# Patient Record
Sex: Female | Born: 1960
Health system: Southern US, Community
[De-identification: ages and names within clinical notes are randomized; demographics above are authoritative.]

## PROBLEM LIST (undated history)

## (undated) DIAGNOSIS — Z923 Personal history of irradiation: Secondary | ICD-10-CM

## (undated) DIAGNOSIS — M1990 Unspecified osteoarthritis, unspecified site: Secondary | ICD-10-CM

## (undated) DIAGNOSIS — E039 Hypothyroidism, unspecified: Secondary | ICD-10-CM

## (undated) DIAGNOSIS — C50919 Malignant neoplasm of unspecified site of unspecified female breast: Secondary | ICD-10-CM

## (undated) DIAGNOSIS — I499 Cardiac arrhythmia, unspecified: Secondary | ICD-10-CM

## (undated) DIAGNOSIS — E559 Vitamin D deficiency, unspecified: Secondary | ICD-10-CM

## (undated) DIAGNOSIS — K219 Gastro-esophageal reflux disease without esophagitis: Secondary | ICD-10-CM

## (undated) DIAGNOSIS — Z8042 Family history of malignant neoplasm of prostate: Secondary | ICD-10-CM

## (undated) DIAGNOSIS — Z803 Family history of malignant neoplasm of breast: Secondary | ICD-10-CM

## (undated) DIAGNOSIS — I10 Essential (primary) hypertension: Secondary | ICD-10-CM

## (undated) HISTORY — DX: Vitamin D deficiency, unspecified: E55.9

## (undated) HISTORY — DX: Essential (primary) hypertension: I10

## (undated) HISTORY — PX: LAPAROSCOPIC HYSTERECTOMY: SHX1926

## (undated) HISTORY — DX: Family history of malignant neoplasm of breast: Z80.3

## (undated) HISTORY — PX: INCONTINENCE SURGERY: SHX676

## (undated) HISTORY — DX: Family history of malignant neoplasm of prostate: Z80.42

## (undated) HISTORY — PX: ABDOMINAL HYSTERECTOMY: SHX81

## (undated) HISTORY — PX: TONSILLECTOMY: SUR1361

## (undated) HISTORY — DX: Malignant neoplasm of unspecified site of unspecified female breast: C50.919

---

## 2005-06-18 ENCOUNTER — Ambulatory Visit: Payer: Self-pay | Admitting: Otolaryngology

## 2005-07-01 ENCOUNTER — Ambulatory Visit: Payer: Self-pay | Admitting: Unknown Physician Specialty

## 2006-02-21 ENCOUNTER — Ambulatory Visit: Payer: Self-pay | Admitting: Otolaryngology

## 2006-05-27 ENCOUNTER — Ambulatory Visit: Payer: Self-pay | Admitting: Otolaryngology

## 2006-06-15 ENCOUNTER — Ambulatory Visit: Payer: Self-pay | Admitting: Otolaryngology

## 2007-01-06 ENCOUNTER — Ambulatory Visit: Payer: Self-pay | Admitting: Unknown Physician Specialty

## 2007-05-02 ENCOUNTER — Ambulatory Visit: Payer: Self-pay | Admitting: Unknown Physician Specialty

## 2007-10-13 ENCOUNTER — Emergency Department: Payer: Self-pay | Admitting: Emergency Medicine

## 2008-06-06 ENCOUNTER — Ambulatory Visit: Payer: Self-pay | Admitting: Unknown Physician Specialty

## 2008-06-12 ENCOUNTER — Ambulatory Visit: Payer: Self-pay | Admitting: Unknown Physician Specialty

## 2008-07-26 HISTORY — PX: NASAL SINUS SURGERY: SHX719

## 2011-01-06 ENCOUNTER — Ambulatory Visit: Payer: Self-pay | Admitting: Internal Medicine

## 2011-01-13 ENCOUNTER — Ambulatory Visit: Payer: Self-pay | Admitting: Internal Medicine

## 2011-06-16 ENCOUNTER — Ambulatory Visit: Payer: Self-pay | Admitting: Internal Medicine

## 2011-06-25 ENCOUNTER — Ambulatory Visit: Payer: Self-pay | Admitting: Internal Medicine

## 2011-07-27 HISTORY — PX: BREAST BIOPSY: SHX20

## 2011-08-06 ENCOUNTER — Ambulatory Visit: Payer: Self-pay | Admitting: Gastroenterology

## 2012-01-10 ENCOUNTER — Ambulatory Visit: Payer: Self-pay | Admitting: Surgery

## 2012-01-26 ENCOUNTER — Ambulatory Visit: Payer: Self-pay | Admitting: Surgery

## 2012-07-12 ENCOUNTER — Ambulatory Visit: Payer: Self-pay | Admitting: Surgery

## 2013-12-13 ENCOUNTER — Ambulatory Visit: Payer: Self-pay | Admitting: Internal Medicine

## 2014-07-22 ENCOUNTER — Ambulatory Visit: Payer: Self-pay | Admitting: Orthopedic Surgery

## 2014-09-20 ENCOUNTER — Encounter: Payer: Self-pay | Admitting: Surgery

## 2014-09-24 ENCOUNTER — Encounter
Admit: 2014-09-24 | Disposition: A | Discharge status: Home / Self Care | Payer: Self-pay | Attending: Surgery | Admitting: Surgery

## 2014-09-27 DIAGNOSIS — E538 Deficiency of other specified B group vitamins: Secondary | ICD-10-CM | POA: Insufficient documentation

## 2014-10-25 ENCOUNTER — Encounter
Admit: 2014-10-25 | Disposition: A | Discharge status: Home / Self Care | Payer: Self-pay | Attending: Surgery | Admitting: Surgery

## 2014-11-08 ENCOUNTER — Other Ambulatory Visit: Payer: Self-pay | Admitting: Internal Medicine

## 2014-11-08 DIAGNOSIS — Z1231 Encounter for screening mammogram for malignant neoplasm of breast: Secondary | ICD-10-CM

## 2014-12-16 ENCOUNTER — Ambulatory Visit
Admission: RE | Admit: 2014-12-16 | Discharge: 2014-12-16 | Disposition: A | Discharge status: Home / Self Care | Payer: 59 | Source: Ambulatory Visit | Attending: Internal Medicine | Admitting: Internal Medicine

## 2014-12-16 DIAGNOSIS — Z1231 Encounter for screening mammogram for malignant neoplasm of breast: Secondary | ICD-10-CM | POA: Diagnosis present

## 2015-09-17 DIAGNOSIS — R5383 Other fatigue: Secondary | ICD-10-CM | POA: Diagnosis not present

## 2015-09-17 DIAGNOSIS — R030 Elevated blood-pressure reading, without diagnosis of hypertension: Secondary | ICD-10-CM | POA: Diagnosis not present

## 2015-09-29 ENCOUNTER — Other Ambulatory Visit: Payer: Self-pay | Admitting: Internal Medicine

## 2015-09-29 DIAGNOSIS — Z0000 Encounter for general adult medical examination without abnormal findings: Secondary | ICD-10-CM | POA: Diagnosis not present

## 2015-09-29 DIAGNOSIS — Z1231 Encounter for screening mammogram for malignant neoplasm of breast: Secondary | ICD-10-CM

## 2015-12-19 ENCOUNTER — Ambulatory Visit: Payer: 59

## 2015-12-23 DIAGNOSIS — I781 Nevus, non-neoplastic: Secondary | ICD-10-CM | POA: Diagnosis not present

## 2015-12-23 DIAGNOSIS — I8391 Asymptomatic varicose veins of right lower extremity: Secondary | ICD-10-CM | POA: Diagnosis not present

## 2015-12-30 ENCOUNTER — Other Ambulatory Visit: Payer: Self-pay | Admitting: Internal Medicine

## 2015-12-30 ENCOUNTER — Ambulatory Visit
Admission: RE | Admit: 2015-12-30 | Discharge: 2015-12-30 | Disposition: A | Discharge status: Home / Self Care | Payer: 59 | Source: Ambulatory Visit | Attending: Internal Medicine | Admitting: Internal Medicine

## 2015-12-30 DIAGNOSIS — Z1231 Encounter for screening mammogram for malignant neoplasm of breast: Secondary | ICD-10-CM | POA: Diagnosis not present

## 2016-05-21 DIAGNOSIS — I10 Essential (primary) hypertension: Secondary | ICD-10-CM | POA: Insufficient documentation

## 2016-05-21 DIAGNOSIS — E538 Deficiency of other specified B group vitamins: Secondary | ICD-10-CM | POA: Diagnosis not present

## 2016-07-08 DIAGNOSIS — H01003 Unspecified blepharitis right eye, unspecified eyelid: Secondary | ICD-10-CM | POA: Diagnosis not present

## 2017-10-21 ENCOUNTER — Other Ambulatory Visit: Payer: Self-pay | Admitting: Internal Medicine

## 2017-10-21 DIAGNOSIS — Z1231 Encounter for screening mammogram for malignant neoplasm of breast: Secondary | ICD-10-CM

## 2017-11-10 ENCOUNTER — Ambulatory Visit
Admission: RE | Admit: 2017-11-10 | Discharge: 2017-11-10 | Disposition: A | Discharge status: Home / Self Care | Payer: No Typology Code available for payment source | Source: Ambulatory Visit | Attending: Internal Medicine | Admitting: Internal Medicine

## 2017-11-10 DIAGNOSIS — Z1231 Encounter for screening mammogram for malignant neoplasm of breast: Secondary | ICD-10-CM | POA: Diagnosis not present

## 2018-07-26 DIAGNOSIS — C50919 Malignant neoplasm of unspecified site of unspecified female breast: Secondary | ICD-10-CM

## 2018-07-26 HISTORY — DX: Malignant neoplasm of unspecified site of unspecified female breast: C50.919

## 2019-07-27 HISTORY — PX: BREAST BIOPSY: SHX20

## 2019-09-04 ENCOUNTER — Other Ambulatory Visit: Payer: Self-pay | Admitting: Internal Medicine

## 2019-09-04 DIAGNOSIS — Z1231 Encounter for screening mammogram for malignant neoplasm of breast: Secondary | ICD-10-CM

## 2019-10-18 ENCOUNTER — Ambulatory Visit
Admission: RE | Admit: 2019-10-18 | Discharge: 2019-10-18 | Disposition: A | Discharge status: Home / Self Care | Payer: No Typology Code available for payment source | Source: Ambulatory Visit | Attending: Internal Medicine | Admitting: Internal Medicine

## 2019-10-18 DIAGNOSIS — Z1231 Encounter for screening mammogram for malignant neoplasm of breast: Secondary | ICD-10-CM | POA: Insufficient documentation

## 2019-10-24 ENCOUNTER — Other Ambulatory Visit: Payer: Self-pay | Admitting: Internal Medicine

## 2019-10-24 DIAGNOSIS — N6310 Unspecified lump in the right breast, unspecified quadrant: Secondary | ICD-10-CM

## 2019-10-24 DIAGNOSIS — R928 Other abnormal and inconclusive findings on diagnostic imaging of breast: Secondary | ICD-10-CM

## 2019-11-09 ENCOUNTER — Ambulatory Visit
Admission: RE | Admit: 2019-11-09 | Discharge: 2019-11-09 | Disposition: A | Discharge status: Home / Self Care | Payer: No Typology Code available for payment source | Source: Ambulatory Visit | Attending: Internal Medicine | Admitting: Internal Medicine

## 2019-11-09 DIAGNOSIS — R928 Other abnormal and inconclusive findings on diagnostic imaging of breast: Secondary | ICD-10-CM | POA: Insufficient documentation

## 2019-11-09 DIAGNOSIS — N6310 Unspecified lump in the right breast, unspecified quadrant: Secondary | ICD-10-CM | POA: Diagnosis present

## 2019-11-19 ENCOUNTER — Other Ambulatory Visit: Payer: Self-pay | Admitting: Internal Medicine

## 2019-11-19 DIAGNOSIS — R928 Other abnormal and inconclusive findings on diagnostic imaging of breast: Secondary | ICD-10-CM

## 2019-11-19 DIAGNOSIS — N6310 Unspecified lump in the right breast, unspecified quadrant: Secondary | ICD-10-CM

## 2019-11-27 ENCOUNTER — Ambulatory Visit: Admission: RE | Admit: 2019-11-27 | Payer: No Typology Code available for payment source | Source: Ambulatory Visit

## 2019-12-12 ENCOUNTER — Ambulatory Visit
Admission: RE | Admit: 2019-12-12 | Discharge: 2019-12-12 | Disposition: A | Discharge status: Home / Self Care | Payer: No Typology Code available for payment source | Source: Ambulatory Visit | Attending: Internal Medicine | Admitting: Internal Medicine

## 2019-12-12 DIAGNOSIS — R928 Other abnormal and inconclusive findings on diagnostic imaging of breast: Secondary | ICD-10-CM | POA: Insufficient documentation

## 2019-12-12 DIAGNOSIS — N6310 Unspecified lump in the right breast, unspecified quadrant: Secondary | ICD-10-CM

## 2019-12-13 ENCOUNTER — Encounter: Payer: Self-pay | Admitting: *Deleted

## 2019-12-13 NOTE — Progress Notes (Signed)
Called patient to establish navigation services.  Left message for her to return my call.

## 2019-12-14 ENCOUNTER — Other Ambulatory Visit: Payer: Self-pay

## 2019-12-14 DIAGNOSIS — C50919 Malignant neoplasm of unspecified site of unspecified female breast: Secondary | ICD-10-CM

## 2019-12-18 ENCOUNTER — Encounter: Payer: Self-pay | Admitting: Oncology

## 2019-12-18 ENCOUNTER — Other Ambulatory Visit: Payer: Self-pay

## 2019-12-18 ENCOUNTER — Other Ambulatory Visit: Payer: Self-pay | Admitting: General Surgery

## 2019-12-18 ENCOUNTER — Inpatient Hospital Stay: Payer: No Typology Code available for payment source

## 2019-12-18 ENCOUNTER — Encounter: Payer: Self-pay | Admitting: *Deleted

## 2019-12-18 ENCOUNTER — Inpatient Hospital Stay: Payer: No Typology Code available for payment source | Attending: Oncology | Admitting: Oncology

## 2019-12-18 VITALS — BP 111/66 | HR 74 | Temp 98.3°F | Resp 16 | Ht 63.0 in | Wt 125.0 lb

## 2019-12-18 DIAGNOSIS — C50411 Malignant neoplasm of upper-outer quadrant of right female breast: Secondary | ICD-10-CM

## 2019-12-18 DIAGNOSIS — Z7189 Other specified counseling: Secondary | ICD-10-CM

## 2019-12-18 DIAGNOSIS — Z170 Estrogen receptor positive status [ER+]: Secondary | ICD-10-CM

## 2019-12-18 LAB — SURGICAL PATHOLOGY

## 2019-12-18 NOTE — Progress Notes (Signed)
Met patient today during her medical oncology consult.  She is newly diagnosed with invasive breast cancer.  Per Dr. Janese Banks it's a clinical stage 1/ ER/PR positivie/ Her 2 negative breast cancer.  She is scheduled for surgery on 01/10/20.  Current plan for surgery followed by radiation and antihormonal therapy and possible Oncoptype Dx testing.  Dr. Janese Banks has recommended genetic testing also.  Gave patient breast cancer educational literature, "My Breast Cancer Treatment Handbook" by Josephine Igo, RN.  She is to call with any questions or needs.

## 2019-12-18 NOTE — Progress Notes (Signed)
Pt is a new breast cancer.

## 2019-12-20 ENCOUNTER — Ambulatory Visit: Payer: Self-pay | Admitting: General Surgery

## 2019-12-20 NOTE — H&P (View-Only) (Signed)
PATIENT PROFILE: Sheri [name] is a 59 y.o. female who presents to the Clinic for consultation at the request of Dr. Sabra Heck for evaluation of breast cancer.  PCP:  Yevonne Pax, MD  HISTORY OF PRESENT ILLNESS: Sheri [name] reports she had her usual screening mammogram.  The mammogram shows suspicious area on the right breast.  This led to diagnostic ultrasound and mammogram.  Discomfort and a small mass in the right breast.  Core needle biopsy was done showing invasive mammary carcinoma with associated DCIS.  Patient denies any breast pain, skin changes, palpable masses or lymphadenopathy.  Family history of breast cancer: Grandmother Family history of other cancers: None Menarche: 59 years old Menopause: In her 35s Used OCP: Unknown Used estrogen and progesterone therapy: Premarin and estrogen patch History of Radiation to the chest: No previous chest radiation Number of pregnancies: 2 Age of first pregnancy: 59 years old Previous breast biopsy: None  PROBLEM LIST:        Problem List  Date Reviewed: 09/07/2019       Noted   Eczema of right eyelid 08/08/2017   Overview    Chronic, recurring      Benign essential hypertension 05/21/2016   B12 deficiency 09/27/2014   Overview    267, 2015      Rosacea Unknown   Premature ovarian failure Unknown   Cervical disc disease Unknown   Overview    C5-6         GENERAL REVIEW OF SYSTEMS:   General ROS: negative for - chills, fatigue, fever, weight gain or weight loss Allergy and Immunology ROS: negative for - hives  Hematological and Lymphatic ROS: negative for - bleeding problems or bruising, negative for palpable nodes Endocrine ROS: negative for - heat or cold intolerance, hair changes Respiratory ROS: negative for - cough, shortness of breath or wheezing Cardiovascular ROS: no chest pain or palpitations GI ROS: negative for nausea, vomiting, abdominal pain, diarrhea,  constipation Musculoskeletal ROS: negative for - joint swelling or muscle pain Neurological ROS: negative for - confusion, syncope Dermatological ROS: negative for pruritus and rash Psychiatric: negative for anxiety, depression, difficulty sleeping and memory loss  MEDICATIONS: Current Medications        Current Outpatient Medications  Medication Sig Dispense Refill  . CALCIUM ACETATE ORAL Take 1,000 mg by mouth 3 (three) times a week.     . cholecalciferol (CHOLECALCIFEROL) 1,000 unit tablet Take 1,000 Units by mouth once daily.    . cyanocobalamin (VITAMIN B12) 1000 MCG tablet Take 1,000 mcg by mouth twice a week.      . estradioL (CLIMARA) 0.0375 mg/24 hr patch Place 1 patch onto the skin once a week 12 patch 3  . hydroCHLOROthiazide (MICROZIDE) 12.5 mg capsule Take 1 capsule (12.5 mg total) by mouth once daily (Patient taking differently: Take 12.5 mg by mouth once daily Takes once or twice weekly  ) 90 capsule 3  . metoprolol succinate (TOPROL-XL) 25 MG XL tablet Take 1 tablet (25 mg total) by mouth once daily (Patient taking differently: Take 25 mg by mouth once daily 1/4 of the pill once weekly  ) 90 tablet 3  . traZODone (DESYREL) 50 MG tablet Take 1 tablet (50 mg total) by mouth nightly (Patient taking differently: Take 50 mg by mouth nightly 0.5 tablet every other night  ) 30 tablet 11  . vitamin B complex (B COMPLEX 1 ORAL) Take by mouth Takes with B12 1x week      . sertraline (  ZOLOFT) 50 MG tablet Take 1 tablet (50 mg total) by mouth once daily (Patient not taking: Reported on 12/18/2019  ) 90 tablet 3   No current facility-administered medications for this visit.      ALLERGIES: Patient has no known allergies.  PAST MEDICAL HISTORY:     Past Medical History:  Diagnosis Date  . Allergic state   . Breast cancer (CMS-HCC) 11/2019  . Cervical disc disease    C5-6  . Eczema, unspecified   . Ovarian cyst, bilateral   . Premature ovarian failure   .  Rosacea   . Ventricular ectopy     PAST SURGICAL HISTORY:      Past Surgical History:  Procedure Laterality Date  . CESAREAN SECTION    . TONSILLECTOMY    . TUBAL LIGATION       FAMILY HISTORY:      Family History  Problem Relation Age of Onset  . No Known Problems Mother   . Breast cancer Maternal Grandmother      SOCIAL HISTORY: Social History          Socioeconomic History  . Marital status: Married    Spouse name: Not on file  . Number of children: Not on file  . Years of education: Not on file  . Highest education level: Not on file  Occupational History  . Not on file  Tobacco Use  . Smoking status: Former Smoker    Packs/day: 1.00    Years: 15.00    Pack years: 15.00    Quit date: 05/05/1991    Years since quitting: 28.6  . Smokeless tobacco: Never Used  Vaping Use  . Vaping Use: Never used  Substance and Sexual Activity  . Alcohol use: Yes    Alcohol/week: 1.0 standard drinks    Types: 1 Glasses of wine per week  . Drug use: No  . Sexual activity: Not on file  Other Topics Concern  . Not on file  Social History Narrative  . Not on file   Social Determinants of Health      Financial Resource Strain:   . Difficulty of Paying Living Expenses:   Food Insecurity:   . Worried About Charity fundraiser in the Last Year:   . Arboriculturist in the Last Year:   Transportation Needs:   . Film/video editor (Medical):   Marland Kitchen Lack of Transportation (Non-Medical):       PHYSICAL EXAM:    Vitals:   12/18/19 1123  BP: 93/63  Pulse: 68   Body mass index is 24.4 kg/m. Weight: 60.5 kg (133 lb 6.4 oz)   GENERAL: Alert, active, oriented x3  HEENT: Pupils equal reactive to light. Extraocular movements are intact. Sclera clear. Palpebral conjunctiva normal red color.Pharynx clear.  NECK: Supple with no palpable mass and no adenopathy.  LUNGS: Sound clear with no rales rhonchi or wheezes.  HEART:  Regular rhythm S1 and S2 without murmur.  BREAST: breasts appear normal, no suspicious masses, no skin or nipple changes or axillary nodes.  ABDOMEN: Soft and depressible, nontender with no palpable mass, no hepatomegaly.  EXTREMITIES: Well-developed well-nourished symmetrical with no dependent edema.  NEUROLOGICAL: Awake alert oriented, facial expression symmetrical, moving all extremities.  REVIEW OF DATA: I have reviewed the following data today:      No visits with results within 3 Month(s) from this visit.  Latest known visit with results is:  Office Visit on 09/07/2019  Component Date Value  .  Color 09/07/2019 Yellow   . Clarity 09/07/2019 Clear   . Specific Gravity 09/07/2019 <=1.005   . pH, Urine 09/07/2019 6.0   . Protein, Urinalysis 09/07/2019 Negative   . Glucose, Urinalysis 09/07/2019 Negative   . Ketones, Urinalysis 09/07/2019 Negative   . Blood, Urinalysis 09/07/2019 Trace*  . Nitrite, Urinalysis 09/07/2019 Negative   . Leukocyte Esterase, Urin* 09/07/2019 Negative   . White Blood Cells, Urina* 09/07/2019 0-3   . Red Blood Cells, Urinaly* 09/07/2019 4-10*  . Bacteria, Urinalysis 09/07/2019 None Seen   . Squamous Epithelial Cell* 09/07/2019 Rare      ASSESSMENT: Ms. Zalenski is a 59 y.o. female presenting for consultation for right breast cancer.    Patient was oriented again about the pathology results. Surgical alternatives were discussed with patient including partial vs total mastectomy. Surgical technique and post operative care was discussed with patient. Risk of surgery was discussed with patient including but not limited to: wound infection, seroma, hematoma, brachial plexopathy, mondor's disease (thrombosis of small veins of breast), chronic wound pain, breast lymphedema, altered sensation to the nipple and cosmesis among others.   We will follow up ER/PR and HER-2 receptors results and medical oncology recommendations for final surgical  decision.  Malignant neoplasm of upper-outer quadrant of right female breast, unspecified estrogen receptor status (CMS-HCC) [C50.411]  PLAN: 1.  Right breast radiofrequency tag guided partial mastectomy and sentinel node biopsy (19301, [id number]) 2.  Avoid taking aspirin 5 days before the surgery 3.  Expect medical oncology evaluation today for further discussion of other treatments for breast cancer 4.  We will keep in contact with you if anything changes after the ER receptors.  Patient verbalized understanding, all questions were answered, and were agreeable with the plan outlined above.     Herbert Pun, MD  Electronically signed by Herbert Pun, MD

## 2019-12-20 NOTE — H&P (Signed)
PATIENT PROFILE: Sheri [name] is a 59 y.o. [name] who presents to the Clinic for consultation at the request of Dr. Sabra Heck for evaluation of breast cancer.  PCP:  Yevonne Pax, MD  HISTORY OF PRESENT ILLNESS: Sheri [name] reports she had her usual screening mammogram.  The mammogram shows suspicious area on the right breast.  This led to diagnostic ultrasound and mammogram.  Discomfort and a small mass in the right breast.  Core needle biopsy was done showing invasive mammary carcinoma with associated DCIS.  Patient denies any breast pain, skin changes, palpable masses or lymphadenopathy.  Family history of breast cancer: Grandmother Family history of other cancers: None Menarche: 59 years old Menopause: In her 63s Used OCP: Unknown Used estrogen and progesterone therapy: Premarin and estrogen patch History of Radiation to the chest: No previous chest radiation Number of pregnancies: 2 Age of first pregnancy: 59 years old Previous breast biopsy: None  PROBLEM LIST:        Problem List  Date Reviewed: 09/07/2019       Noted   Eczema of right eyelid 08/08/2017   Overview    Chronic, recurring      Benign essential hypertension 05/21/2016   B12 deficiency 09/27/2014   Overview    267, 2015      Rosacea Unknown   Premature ovarian failure Unknown   Cervical disc disease Unknown   Overview    C5-6         GENERAL REVIEW OF SYSTEMS:   General ROS: negative for - chills, fatigue, fever, weight gain or weight loss Allergy and Immunology ROS: negative for - hives  Hematological and Lymphatic ROS: negative for - bleeding problems or bruising, negative for palpable nodes Endocrine ROS: negative for - heat or cold intolerance, hair changes Respiratory ROS: negative for - cough, shortness of breath or wheezing Cardiovascular ROS: no chest pain or palpitations GI ROS: negative for nausea, vomiting, abdominal pain, diarrhea,  constipation Musculoskeletal ROS: negative for - joint swelling or muscle pain Neurological ROS: negative for - confusion, syncope Dermatological ROS: negative for pruritus and rash Psychiatric: negative for anxiety, depression, difficulty sleeping and memory loss  MEDICATIONS: Current Medications        Current Outpatient Medications  Medication Sig Dispense Refill  . CALCIUM ACETATE ORAL Take 1,000 mg by mouth 3 (three) times a week.     . cholecalciferol (CHOLECALCIFEROL) 1,000 unit tablet Take 1,000 Units by mouth once daily.    . cyanocobalamin (VITAMIN B12) 1000 MCG tablet Take 1,000 mcg by mouth twice a week.      . estradioL (CLIMARA) 0.0375 mg/24 hr patch Place 1 patch onto the skin once a week 12 patch 3  . hydroCHLOROthiazide (MICROZIDE) 12.5 mg capsule Take 1 capsule (12.5 mg total) by mouth once daily (Patient taking differently: Take 12.5 mg by mouth once daily Takes once or twice weekly  ) 90 capsule 3  . metoprolol succinate (TOPROL-XL) 25 MG XL tablet Take 1 tablet (25 mg total) by mouth once daily (Patient taking differently: Take 25 mg by mouth once daily 1/4 of the pill once weekly  ) 90 tablet 3  . traZODone (DESYREL) 50 MG tablet Take 1 tablet (50 mg total) by mouth nightly (Patient taking differently: Take 50 mg by mouth nightly 0.5 tablet every other night  ) 30 tablet 11  . vitamin B complex (B COMPLEX 1 ORAL) Take by mouth Takes with B12 1x week      . sertraline (  ZOLOFT) 50 MG tablet Take 1 tablet (50 mg total) by mouth once daily (Patient not taking: Reported on 12/18/2019  ) 90 tablet 3   No current facility-administered medications for this visit.      ALLERGIES: Patient has no known allergies.  PAST MEDICAL HISTORY:     Past Medical History:  Diagnosis Date  . Allergic state   . Breast cancer (CMS-HCC) 11/2019  . Cervical disc disease    C5-6  . Eczema, unspecified   . Ovarian cyst, bilateral   . Premature ovarian failure   .  Rosacea   . Ventricular ectopy     PAST SURGICAL HISTORY:      Past Surgical History:  Procedure Laterality Date  . CESAREAN SECTION    . TONSILLECTOMY    . TUBAL LIGATION       FAMILY HISTORY:      Family History  Problem Relation Age of Onset  . No Known Problems Mother   . Breast cancer Maternal Grandmother      SOCIAL HISTORY: Social History          Socioeconomic History  . Marital status: Married    Spouse name: Not on file  . Number of children: Not on file  . Years of education: Not on file  . Highest education level: Not on file  Occupational History  . Not on file  Tobacco Use  . Smoking status: Former Smoker    Packs/day: 1.00    Years: 15.00    Pack years: 15.00    Quit date: 05/05/1991    Years since quitting: 28.6  . Smokeless tobacco: Never Used  Vaping Use  . Vaping Use: Never used  Substance and Sexual Activity  . Alcohol use: Yes    Alcohol/week: 1.0 standard drinks    Types: 1 Glasses of wine per week  . Drug use: No  . Sexual activity: Not on file  Other Topics Concern  . Not on file  Social History Narrative  . Not on file   Social Determinants of Health      Financial Resource Strain:   . Difficulty of Paying Living Expenses:   Food Insecurity:   . Worried About Charity fundraiser in the Last Year:   . Arboriculturist in the Last Year:   Transportation Needs:   . Film/video editor (Medical):   Marland Kitchen Lack of Transportation (Non-Medical):       PHYSICAL EXAM:    Vitals:   12/18/19 1123  BP: 93/63  Pulse: 68   Body mass index is 24.4 kg/m. Weight: 60.5 kg (133 lb 6.4 oz)   GENERAL: Alert, active, oriented x3  HEENT: Pupils equal reactive to light. Extraocular movements are intact. Sclera clear. Palpebral conjunctiva normal red color.Pharynx clear.  NECK: Supple with no palpable mass and no adenopathy.  LUNGS: Sound clear with no rales rhonchi or wheezes.  HEART:  Regular rhythm S1 and S2 without murmur.  BREAST: breasts appear normal, no suspicious masses, no skin or nipple changes or axillary nodes.  ABDOMEN: Soft and depressible, nontender with no palpable mass, no hepatomegaly.  EXTREMITIES: Well-developed well-nourished symmetrical with no dependent edema.  NEUROLOGICAL: Awake alert oriented, facial expression symmetrical, moving all extremities.  REVIEW OF DATA: I have reviewed the following data today:      No visits with results within 3 Month(s) from this visit.  Latest known visit with results is:  Office Visit on 09/07/2019  Component Date Value  .  Color 09/07/2019 Yellow   . Clarity 09/07/2019 Clear   . Specific Gravity 09/07/2019 <=1.005   . pH, Urine 09/07/2019 6.0   . Protein, Urinalysis 09/07/2019 Negative   . Glucose, Urinalysis 09/07/2019 Negative   . Ketones, Urinalysis 09/07/2019 Negative   . Blood, Urinalysis 09/07/2019 Trace*  . Nitrite, Urinalysis 09/07/2019 Negative   . Leukocyte Esterase, Urin* 09/07/2019 Negative   . White Blood Cells, Urina* 09/07/2019 0-3   . Red Blood Cells, Urinaly* 09/07/2019 4-10*  . Bacteria, Urinalysis 09/07/2019 None Seen   . Squamous Epithelial Cell* 09/07/2019 Rare      ASSESSMENT: Sheri [name] is a 59 y.o. [name] presenting for consultation for right breast cancer.    Patient was oriented again about the pathology results. Surgical alternatives were discussed with patient including partial vs total mastectomy. Surgical technique and post operative care was discussed with patient. Risk of surgery was discussed with patient including but not limited to: wound infection, seroma, hematoma, brachial plexopathy, mondor's disease (thrombosis of small veins of breast), chronic wound pain, breast lymphedema, altered sensation to the nipple and cosmesis among others.   We will follow up ER/PR and HER-2 receptors results and medical oncology recommendations for final surgical  decision.  Malignant neoplasm of upper-outer quadrant of right [name] breast, unspecified estrogen receptor status (CMS-HCC) [C50.411]  PLAN: 1.  Right breast radiofrequency tag guided partial mastectomy and sentinel node biopsy (19301, [id number]) 2.  Avoid taking aspirin 5 days before the surgery 3.  Expect medical oncology evaluation today for further discussion of other treatments for breast cancer 4.  We will keep in contact with you if anything changes after the ER receptors.  Patient verbalized understanding, all questions were answered, and were agreeable with the plan outlined above.     Herbert Pun, MD  Electronically signed by Herbert Pun, MD

## 2019-12-25 DIAGNOSIS — C50919 Malignant neoplasm of unspecified site of unspecified female breast: Secondary | ICD-10-CM | POA: Insufficient documentation

## 2019-12-25 DIAGNOSIS — Z7189 Other specified counseling: Secondary | ICD-10-CM | POA: Insufficient documentation

## 2019-12-25 NOTE — Progress Notes (Signed)
Hematology/Oncology Consult note [facility] [phone] [phone]  Patient Care Team: Rusty Aus, MD as PCP - General (Internal Medicine) Rico Junker, RN as Registered Nurse   Name of the patient: Sheri [name]  [id number]  10/05/1960    Reason for referral-new diagnosis of breast cancer   Referring physician-Dr. Emily Filbert  Date of visit: 12/25/19   History of presenting illness- Patient is a 59 year old female with a past medical history significant for hypertension who recently underwent a screening mammogram on 10/18/2019 which showed a possible mass in the right breast.  This was followed by a diagnostic mammogram and ultrasound which showed hypoechoic mass measuring 0.8 x 0.8 x 0.9 cm at 10 o'clock position in the right breast 7 cm from the nipple.  Right axilla demonstrates normal-appearing lymph nodes.  This was biopsied and was consistent with invasive mammary carcinoma grade 1, 8 mm ER 91 -100% positive, PR 11 to 20% positive and HER-2/neu negative.   Patient is doing well for her age and denies any complaints at this time.  Menarche at the age of 84.  She used hormone replacement therapy for a few years in the past.  G2, P2 L2.  No prior breast biopsies.  Age at first pregnancy 28 years.  Menopause in her 54s.  Family history significant for breast cancer in her maternal grandmother.  ECOG PS- 0  Pain scale- 0   Review of systems- Review of Systems  Constitutional: Negative for chills, fever, malaise/fatigue and weight loss.  HENT: Negative for congestion, ear discharge and nosebleeds.   Eyes: Negative for blurred vision.  Respiratory: Negative for cough, hemoptysis, sputum production, shortness of breath and wheezing.   Cardiovascular: Negative for chest pain, palpitations, orthopnea and claudication.  Gastrointestinal: Negative for abdominal pain, blood in stool, constipation, diarrhea, heartburn, melena, nausea  and vomiting.  Genitourinary: Negative for dysuria, flank pain, frequency, hematuria and urgency.  Musculoskeletal: Negative for back pain, joint pain and myalgias.  Skin: Negative for rash.  Neurological: Negative for dizziness, tingling, focal weakness, seizures, weakness and headaches.  Endo/Heme/Allergies: Does not bruise/bleed easily.  Psychiatric/Behavioral: Negative for depression and suicidal ideas. The patient does not have insomnia.     Allergies  Allergen Reactions   Tape Other (See Comments)    Tears skin/redness (PAPER TAPE ONLY)    There are no problems to display for this patient.    Past Medical History:  Diagnosis Date   Breast cancer ([place])    Hypertension    Vitamin D deficiency      Past Surgical History:  Procedure Laterality Date   ABDOMINAL HYSTERECTOMY     complete   BREAST BIOPSY Left 2013   negative core   CESAREAN SECTION     INCONTINENCE SURGERY     NASAL SINUS SURGERY Left 2010   TONSILLECTOMY      Social History   Socioeconomic History   Marital status: Married    Spouse name: Not on file   Number of children: Not on file   Years of education: Not on file   Highest education level: Not on file  Occupational History   Not on file  Tobacco Use   Smoking status: Not on file  Substance and Sexual Activity   Alcohol use: Not on file   Drug use: Not on file   Sexual activity: Not on file  Other Topics Concern   Not on file  Social History Narrative   Not on file  Social Determinants of Health   Financial Resource Strain:    Difficulty of Paying Living Expenses:   Food Insecurity:    Worried About Charity fundraiser in the Last Year:    Arboriculturist in the Last Year:   Transportation Needs:    Film/video editor (Medical):    Lack of Transportation (Non-Medical):   Physical Activity:    Days of Exercise per Week:    Minutes of Exercise per Session:   Stress:    Feeling of Stress :     Social Connections:    Frequency of Communication with Friends and Family:    Frequency of Social Gatherings with Friends and Family:    Attends Religious Services:    Active Member of Clubs or Organizations:    Attends Music therapist:    Marital Status:   Intimate Partner Violence:    Fear of Current or Ex-Partner:    Emotionally Abused:    Physically Abused:    Sexually Abused:      Family History  Problem Relation Age of Onset   Breast cancer Maternal Grandmother 51   Prostate cancer Father    Prostate cancer Brother    Uterine cancer Paternal Grandmother      Current Outpatient Medications:    B Complex Vitamins (VITAMIN B COMPLEX PO), Take 1 tablet by mouth once a week., Disp: , Rfl:    Cholecalciferol 25 MCG (1000 UT) tablet, Take 1,000 Units by mouth daily. , Disp: , Rfl:    hydrochlorothiazide (MICROZIDE) 12.5 MG capsule, Take 12.5 mg by mouth daily as needed (elevated blood pressure/fluid retention.). , Disp: , Rfl:    metoprolol succinate (TOPROL-XL) 25 MG 24 hr tablet, Take 12.5 mg by mouth daily as needed (elevated blood pressure.). , Disp: , Rfl:    traZODone (DESYREL) 50 MG tablet, Take 25 mg by mouth every other day. At night, Disp: , Rfl:    acetaminophen (TYLENOL) 500 MG tablet, Take 500-1,000 mg by mouth every 6 (six) hours as needed (for pain.)., Disp: , Rfl:    calcium carbonate (TUMS - DOSED IN MG ELEMENTAL CALCIUM) 500 MG chewable tablet, Chew 1-2 tablets by mouth daily as needed for indigestion or heartburn., Disp: , Rfl:    CALCIUM PO, Take 1 tablet by mouth once a week., Disp: , Rfl:    Physical exam:  Vitals:   12/18/19 1510  BP: 111/66  Pulse: 74  Resp: 16  Temp: 98.3 F (36.8 C)  TempSrc: Oral  Weight: 125 lb (56.7 kg)  Height: 5' 3"  (1.6 m)   Physical Exam Constitutional:      General: She is not in acute distress. Cardiovascular:     Rate and Rhythm: Normal rate and regular rhythm.     Heart  sounds: Normal heart sounds.  Pulmonary:     Effort: Pulmonary effort is normal.     Breath sounds: Normal breath sounds.  Abdominal:     General: Bowel sounds are normal.     Palpations: Abdomen is soft.  Skin:    General: Skin is warm and dry.  Neurological:     Mental Status: She is alert and oriented to person, place, and time.   Breast exam performed in sitting and lying down position: There is some induration noted at the site of recent right breast biopsy.  No distinct palpable mass.  No palpable masses in the left breast.  No palpable bilateral axillary adenopathy.  No nipple inversion  or discharge.    No flowsheet data found. No flowsheet data found.  No images are attached to the encounter.  MM CLIP PLACEMENT RIGHT  Result Date: 12/12/2019 CLINICAL DATA:  Post biopsy mammogram of the right breast for clip placement. EXAM: DIAGNOSTIC RIGHT MAMMOGRAM POST ULTRASOUND BIOPSY COMPARISON:  Previous exam(s). FINDINGS: Mammographic images were obtained following ultrasound guided biopsy of a mass in the right breast at 10 o'clock. The biopsy marking clip is in expected position at the site of biopsy. IMPRESSION: Appropriate positioning of the heart shaped biopsy marking clip at the site of biopsy in the upper-outer right breast. Final Assessment: Post Procedure Mammograms for Marker Placement Electronically Signed   By: Ammie Ferrier M.D.   On: 12/12/2019 13:39   [place] RT BREAST BX W LOC DEV 1ST LESION IMG BX SPEC US GUIDE  Addendum Date: 12/13/2019   ADDENDUM REPORT: 12/13/2019 13:20 ADDENDUM: PATHOLOGY revealed: A. RIGHT BREAST, ULTRASOUND-GUIDED BIOPSY: - INVASIVE MAMMARY CARCINOMA, NO SPECIAL TYPE. 8 mm in this sample. Grade 1. Ductal carcinoma in situ: Present, low-grade. Lymphovascular invasion: Not identified. Pathology results are CONCORDANT with imaging findings, per Dr. Ammie Ferrier. Pathology results and recommendations below were discussed with patient by telephone on  12/13/2019. Patient reported biopsy site doing well with slight tenderness at the site. Post biopsy care instructions were reviewed and questions were answered. Patient was instructed to call [facility] if any concerns or questions arise related to the biopsy. Recommendation: Surgical referral. Request for surgical referral was relayed to [place] and Tanya Nones RN at [facility] by Electa Sniff RN on 12/13/2019. Addendum by Electa Sniff RN on 12/13/2019. Electronically Signed   By: Ammie Ferrier M.D.   On: 12/13/2019 13:20   Result Date: 12/13/2019 CLINICAL DATA:  59 year old female presenting for ultrasound-guided biopsy of a right breast mass. EXAM: ULTRASOUND GUIDED RIGHT BREAST CORE NEEDLE BIOPSY COMPARISON:  Previous exam(s). PROCEDURE: I met with the patient and we discussed the procedure of ultrasound-guided biopsy, including benefits and alternatives. We discussed the high likelihood of a successful procedure. We discussed the risks of the procedure, including infection, bleeding, tissue injury, clip migration, and inadequate sampling. Informed written consent was given. The usual time-out protocol was performed immediately prior to the procedure. Pre biopsy images of mass in the right breast at 10 o'clock demonstrates 2 adjacent masses versus one contiguous mass all together measuring 1.7 cm. Lesion quadrant: Upper outer quadrant Using sterile technique and 1% Lidocaine as local anesthetic, under direct ultrasound visualization, a 14 gauge spring-loaded device was used to perform biopsy of mass in the right breast at 10 o'clock using an inferior approach. At the conclusion of the procedure heart shaped tissue marker clip was deployed into the biopsy cavity. Follow up 2 view mammogram was performed and dictated separately. IMPRESSION: Ultrasound guided biopsy of a 1.7 cm right breast mass at 10 o'clock. No apparent complications. Electronically Signed: By: Ammie Ferrier M.D. On: 12/12/2019 13:34    Assessment and plan- Patient is a 59 y.o. female with newly diagnosed invasive mammary carcinoma of the right breast clinical prognostic stage Ia c T1b cN0 cM0 ER/PR positive HER-2/neu negative on core biopsy  I discussed the results of the mammogram and core biopsy with the patient in detail.  Overall patient was found to have a small breast mass roughly 0.8 cm in size on mammogram.  On biopsy this was a grade 1 ER strongly +91-100%, PR 1 to 10% positive and  HER-2 negative tumor.  In general ER/PR positive and HER-2 negative tumors are biologically more favorable as compared to triple negative or HER-2 positive tumors.  I would therefore recommend upfront lumpectomy and sentinel lymph node biopsy at this time.  Depending on the size and grade of the final pathology as well as the number of lymph nodes involved, patient will very likely need Oncotype testing to determine if she would benefit from adjuvant chemotherapy.  I discussed what Oncotype testing is and how the results of interpret.  At her age if her score is 25 or less she would be in the low and intermediate risk group and would not benefit from adjuvant chemotherapy.  Adjuvant chemotherapy would be recommended if her score is 26 or higher.  We will plan to send off this test after final pathology is back.  I will tentatively see her back in 4 weeks time to discuss the results of Oncotype testing as well as hormone therapy for breast cancer.  Patient will also see Dr. Donella Stade on the same day to discuss adjuvant radiation treatment.  Given that her tumor was ER positive there would be role for hormone therapy for 5 years.  Treatment will be given with a curative intent  We will also refer patient for genetic testing  Cancer Staging Invasive carcinoma of breast [place] [facility]) Staging form: Breast, AJCC 8th Edition - Clinical stage from 12/18/2019: Stage IA (cT1b, cN0, cM0, G1, ER+, PR+, HER2-) - Signed by Sindy Guadeloupe, MD on 12/25/2019     Total face to face encounter time for this patient visit was 45 min.   Thank you for this kind referral and the opportunity to participate in the care of this patient   Visit Diagnosis 1. Malignant neoplasm of upper-outer quadrant of right breast in female, estrogen receptor positive ([place])   2. Goals of care, counseling/discussion     Dr. Randa Evens, MD, MPH [facility] at [facility] [phone] 12/25/2019  8:53 AM

## 2019-12-28 ENCOUNTER — Encounter
Admission: RE | Admit: 2019-12-28 | Discharge: 2019-12-28 | Disposition: A | Discharge status: Home / Self Care | Payer: No Typology Code available for payment source | Source: Ambulatory Visit | Attending: General Surgery | Admitting: General Surgery

## 2019-12-28 ENCOUNTER — Other Ambulatory Visit: Payer: Self-pay

## 2019-12-28 DIAGNOSIS — Z01818 Encounter for other preprocedural examination: Secondary | ICD-10-CM | POA: Insufficient documentation

## 2019-12-28 DIAGNOSIS — I10 Essential (primary) hypertension: Secondary | ICD-10-CM | POA: Diagnosis not present

## 2019-12-28 HISTORY — DX: Cardiac arrhythmia, unspecified: I49.9

## 2019-12-28 HISTORY — DX: Gastro-esophageal reflux disease without esophagitis: K21.9

## 2019-12-28 NOTE — Patient Instructions (Signed)
Your procedure is scheduled on: Mon 6/14 Report to Nuclear Med at  8:30  Remember: Instructions that are not followed completely may result in serious medical risk,  up to and including death, or upon the discretion of your surgeon and anesthesiologist your  surgery may need to be rescheduled.     _X__ 1. Do not eat food after midnight the night before your procedure.                 No gum chewing or hard candies. You may drink clear liquids up to 2 hours                 before you are scheduled to arrive for your surgery- DO not drink clear                 liquids within 2 hours of the start of your surgery.                 Clear Liquids include:  water, apple juice without pulp, clear Gatorade, G2 or                  Gatorade Zero (avoid Red/Purple/Blue), Black Coffee or Tea (Do not add                 anything to coffee or tea). _____2.   Complete the carbohydrate drink provided to you, 2 hours before arrival.  __X__2.  On the morning of surgery brush your teeth with toothpaste and water, you                may rinse your mouth with mouthwash if you wish.  Do not swallow any toothpaste of mouthwash.     _X__ 3.  No Alcohol for 24 hours before or after surgery.   ___ 4.  Do Not Smoke or use e-cigarettes For 24 Hours Prior to Your Surgery.                 Do not use any chewable tobacco products for at least 6 hours prior to                 Surgery.  ___  5.  Do not use any recreational drugs (marijuana, cocaine, heroin, ecstacy, MDMA or other)                For at least one week prior to your surgery.  Combination of these drugs with anesthesia                May have life threatening results.  ____  6.  Bring all medications with you on the day of surgery if instructed.   __x__  7.  Notify your doctor if there is any change in your medical condition      (cold, fever, infections).     Do not wear jewelry, make-up, hairpins, clips or nail polish. Do not  wear lotions, powders, or perfumes. . Do not shave 48 hours prior to surgery.  Do not bring valuables to the hospital.    [facility] is not responsible for any belongings or valuables.  Contacts, dentures or bridgework may not be worn into surgery. Leave your suitcase in the car. After surgery it may be brought to your room. For patients admitted to the hospital, discharge time is determined by your treatment team.   Patients discharged the day of surgery will not be allowed to drive home.   Make arrangements for someone  to be with you for the first 24 hours of your Same Day Discharge.    Please read over the following fact sheets that you were given:    __x__ Take these medicines the morning of surgery with A SIP OF WATER:    1. metoprolol succinate (TOPROL-XL) 25 MG 24 hr tablet  (12.5mg )  2.   3.   4.  5.  6.  ____ Fleet Enema (as directed)   __x__ Use CHG Soap (or wipes) as directed  ____ Use Benzoyl Peroxide Gel as instructed  ____ Use inhalers on the day of surgery  ____ Stop metformin 2 days prior to surgery    ____ Take 1/2 of usual insulin dose the night before surgery. No insulin the morning          of surgery.   ____ Stop Coumadin/Plavix/aspirin on   __x__ Stop Anti-inflammatories  No ibuprofen aleve or aspirin after 6/7    May have tylenol   ____ Stop supplements until after surgery.    ____ Bring C-Pap to the hospital.   Ice Pack:  1 part rubbing alcohol 2 parts water in a snack back.  Freeze.  Wrap in cloth and use at surgical site for comfort. Bring a sports bra with no under wire Buy Stool softners for use after and have a small pillow available to reduce pressure under your arm.

## 2020-01-01 ENCOUNTER — Ambulatory Visit
Admission: RE | Admit: 2020-01-01 | Discharge: 2020-01-01 | Disposition: A | Discharge status: Home / Self Care | Payer: No Typology Code available for payment source | Source: Ambulatory Visit | Attending: General Surgery | Admitting: General Surgery

## 2020-01-01 DIAGNOSIS — C50411 Malignant neoplasm of upper-outer quadrant of right female breast: Secondary | ICD-10-CM | POA: Insufficient documentation

## 2020-01-01 HISTORY — PX: BREAST EXCISIONAL BIOPSY: SUR124

## 2020-01-02 ENCOUNTER — Other Ambulatory Visit: Payer: Self-pay | Admitting: *Deleted

## 2020-01-03 ENCOUNTER — Other Ambulatory Visit: Payer: No Typology Code available for payment source

## 2020-01-03 ENCOUNTER — Inpatient Hospital Stay: Payer: No Typology Code available for payment source | Attending: Oncology | Admitting: Licensed Clinical Social Worker

## 2020-01-03 ENCOUNTER — Other Ambulatory Visit
Admission: RE | Admit: 2020-01-03 | Discharge: 2020-01-03 | Disposition: A | Discharge status: Home / Self Care | Payer: No Typology Code available for payment source | Source: Ambulatory Visit | Attending: General Surgery | Admitting: General Surgery

## 2020-01-03 ENCOUNTER — Inpatient Hospital Stay: Payer: No Typology Code available for payment source

## 2020-01-03 ENCOUNTER — Encounter: Payer: Self-pay | Admitting: Licensed Clinical Social Worker

## 2020-01-03 ENCOUNTER — Other Ambulatory Visit: Payer: Self-pay

## 2020-01-03 DIAGNOSIS — Z803 Family history of malignant neoplasm of breast: Secondary | ICD-10-CM

## 2020-01-03 DIAGNOSIS — C50411 Malignant neoplasm of upper-outer quadrant of right female breast: Secondary | ICD-10-CM | POA: Insufficient documentation

## 2020-01-03 DIAGNOSIS — Z01818 Encounter for other preprocedural examination: Secondary | ICD-10-CM | POA: Diagnosis not present

## 2020-01-03 DIAGNOSIS — I10 Essential (primary) hypertension: Secondary | ICD-10-CM

## 2020-01-03 DIAGNOSIS — R001 Bradycardia, unspecified: Secondary | ICD-10-CM | POA: Insufficient documentation

## 2020-01-03 DIAGNOSIS — Z20822 Contact with and (suspected) exposure to covid-19: Secondary | ICD-10-CM | POA: Insufficient documentation

## 2020-01-03 DIAGNOSIS — Z8042 Family history of malignant neoplasm of prostate: Secondary | ICD-10-CM

## 2020-01-03 DIAGNOSIS — C50919 Malignant neoplasm of unspecified site of unspecified female breast: Secondary | ICD-10-CM

## 2020-01-03 DIAGNOSIS — Z170 Estrogen receptor positive status [ER+]: Secondary | ICD-10-CM | POA: Insufficient documentation

## 2020-01-03 NOTE — Progress Notes (Addendum)
REFERRING PROVIDER: Noreene Filbert, MD [place]   [address]   [place],  [place] [address]  PRIMARY PROVIDER:  Rusty Aus, MD  PRIMARY REASON FOR VISIT:  1. Invasive carcinoma of breast ([facility])   2. Family history of breast cancer   3. Family history of prostate cancer      HISTORY OF PRESENT ILLNESS:   Sheri [name], a 59 y.o. female, was seen for a [facility] cancer genetics consultation at the request of Dr. Baruch Gouty due to a personal and family history of cancer.  Sheri [name] presents to clinic today to discuss the possibility of a hereditary predisposition to cancer, genetic testing, and to further clarify her future cancer risks, as well as potential cancer risks for family members.   In 2021, at the age of 82, Sheri [name] was diagnosed with invasive mammary carcinoma of the right breast, ER/PR+, Her2-. The treatment plan includes partial mastectomy which is scheduled for 6/14, radiation and possibly chemotherapy.   CANCER HISTORY:  Oncology History  Invasive carcinoma of breast ([facility])  12/18/2019 Cancer Staging   Staging form: Breast, AJCC 8th Edition - Clinical stage from 12/18/2019: Stage IA (cT1b, cN0, cM0, G1, ER+, PR+, HER2-) - Signed by Sindy Guadeloupe, MD on 12/25/2019   12/25/2019 Initial Diagnosis   Invasive carcinoma of breast ([facility])      RISK FACTORS:  Menarche was at age 30.  First live birth at age 77.  OCP use for approximately 20 years.  Ovaries intact: no.  Hysterectomy: yes.  Menopausal status: postmenopausal.  HRT use: yes, several years Colonoscopy: yes; normal. Mammogram within the last year: yes. Number of breast biopsies: 2. Up to date with pelvic exams: yes. Any excessive radiation exposure in the past: no  Past Medical History:  Diagnosis Date  . Breast cancer ([facility])   . Dysrhythmia    tachycardia  . Family history of breast cancer   . Family history of prostate cancer   . GERD (gastroesophageal reflux disease)   . Hypertension   .  Vitamin D deficiency     Past Surgical History:  Procedure Laterality Date  . ABDOMINAL HYSTERECTOMY     complete  . BREAST BIOPSY Left 2013   negative core  . BREAST BIOPSY Right 2021   INVASIVE MAMMARY CARCINOMA Grade 1. Ductal carcinoma in situ: Present, low-grade  . BREAST EXCISIONAL BIOPSY Right 01/01/2020   RF tag placement [id number]  . CESAREAN SECTION    . INCONTINENCE SURGERY    . NASAL SINUS SURGERY Left 2010  . TONSILLECTOMY      Social History   Socioeconomic History  . Marital status: Divorced    Spouse name: Not on file  . Number of children: Not on file  . Years of education: Not on file  . Highest education level: Not on file  Occupational History  . Not on file  Tobacco Use  . Smoking status: Former Research scientist (life sciences)  . Smokeless tobacco: Never Used  . Tobacco comment: 33 years ago  Vaping Use  . Vaping Use: Never used  Substance and Sexual Activity  . Alcohol use: Not Currently  . Drug use: Never  . Sexual activity: Not on file  Other Topics Concern  . Not on file  Social History Narrative  . Not on file   Social Determinants of Health   Financial Resource Strain:   . Difficulty of Paying Living Expenses:   Food Insecurity:   . Worried About Charity fundraiser  in the Last Year:   . [place] in the Last Year:   Transportation Needs:   . Film/video editor (Medical):   [place] Kitchen Lack of Transportation (Non-Medical):   Physical Activity:   . Days of Exercise per Week:   . Minutes of Exercise per Session:   Stress:   . Feeling of Stress :   Social Connections:   . Frequency of Communication with Friends and Family:   . Frequency of Social Gatherings with Friends and Family:   . Attends Religious Services:   . Active Member of Clubs or Organizations:   . Attends Archivist Meetings:   Marland Kitchen Marital Status:      FAMILY HISTORY:  We obtained a detailed, 4-generation family history.  Significant diagnoses are listed below: Family History   Problem Relation Age of Onset  . Breast cancer Maternal Grandmother        dx late 59s  . Prostate cancer Father        dx 29s  . Prostate cancer Brother        dx 40s-50s  . Cancer Paternal Grandmother        tumor on ureter dx 43s   Sheri [name] has 2 daughters, ages 75 and 38. She has a brother, 7, who had prostate cancer in his late 19s-50s. She also has a sister who has not had cancer.  Sheri [name] mother died at 11, no cancers. Patient does not have maternal aunts/uncles/first cousins. Maternal grandmother had breast cancer in her late 68s, unsure age of death. Maternal grandfather died at 59 in a tractor accident.   Sheri [name] father was diagnosed with prostate and bladder cancer in his 58s and died at 45. Patient does not have paternal aunts/uncles/first cousins. Paternal grandmother had cancer on her ureter, possibly kidney cancer, in her 52s. Grandfather died in his 43s-50s due to heart issues.   Sheri [name] is unaware of previous family history of genetic testing for hereditary cancer risks. Patient's ancestors are of [place] and [place] descent. There is no reported Ashkenazi Jewish ancestry. There is no known consanguinity.  GENETIC COUNSELING ASSESSMENT: Sheri [name] is a 59 y.o. female with a personal and family history which is somewhat suggestive of a hereditary cancer syndrome and predisposition to cancer. We, therefore, discussed and recommended the following at today's visit.   DISCUSSION: We discussed that 5 - 10% of breast cancer is hereditary, with most cases associated with BRCA1/BRCA2 mutations.  There are other genes that can be associated with hereditary breast cancer syndromes.  These include PALB2, ATM, CHEK2. We also discussed that some of these genes are associated with prostate cancer as well.  We discussed that testing is beneficial for several reasons including knowing how to follow individuals after completing their treatment, and understand if other family  members could be at risk for cancer and allow them to undergo genetic testing.   We reviewed the characteristics, features and inheritance patterns of hereditary cancer syndromes. We also discussed genetic testing, including the appropriate family members to test, the process of testing, insurance coverage and turn-around-time for results. We discussed the implications of a negative, positive and/or variant of uncertain significant result. We recommended Sheri [name] pursue genetic testing for the [facility] Common Hereditary Cancers gene panel.   The Common Hereditary Cancers Panel offered by [facility] includes sequencing and/or deletion duplication testing of the following 48 genes: APC, ATM, AXIN2, BARD1, BMPR1A, BRCA1, BRCA2, BRIP1, CDH1, CDKN2A (p14ARF), CDKN2A (p16INK4a), CKD4,  CHEK2, CTNNA1, DICER1, EPCAM (Deletion/duplication testing only), GREM1 (promoter region deletion/duplication testing only), KIT, MEN1, MLH1, MSH2, MSH3, MSH6, MUTYH, NBN, NF1, NHTL1, PALB2, PDGFRA, PMS2, POLD1, POLE, PTEN, RAD50, RAD51C, RAD51D, RNF43, SDHB, SDHC, SDHD, SMAD4, SMARCA4. STK11, TP53, TSC1, TSC2, and VHL.  The following genes were evaluated for sequence changes only: SDHA and HOXB13 c.251G>A variant only.  Based on Sheri [name]'s personal and family history of cancer, she meets medical criteria for genetic testing. Despite that she meets criteria, she may still have an out of pocket cost. We discussed that if her out of pocket cost for testing is over $100, the laboratory will call and confirm whether she wants to proceed with testing.  If the out of pocket cost of testing is less than $100 she will be billed by the genetic testing laboratory.   PLAN: After considering the risks, benefits, and limitations, Sheri [name] provided informed consent to pursue genetic testing and the blood sample was sent to [facility] for analysis of the Common Hereditary Cancers Panel. Results should be available within  approximately 2-3 weeks' time, at which point they will be disclosed by telephone to Sheri [name], as will any additional recommendations warranted by these results. Sheri [name] will receive a summary of her genetic counseling visit and a copy of her results once available. This information will also be available in Epic.   Sheri [name] questions were answered to her satisfaction today. Our contact information was provided should additional questions or concerns arise. Thank you for the referral and allowing [place] to share in the care of your patient.   Faith Rogue, MS, [facility] Genetic Counselor [email or web address] Phone: [phone]  The patient was seen for a total of 40 minutes in face-to-face genetic counseling.  Dr. Grayland Ormond was available for discussion regarding this case.   _______________________________________________________________________ For Office Staff:  Number of people involved in session: 1 Was an Intern/ student involved with case: no

## 2020-01-04 LAB — SARS CORONAVIRUS 2 (TAT 6-24 HRS): SARS Coronavirus 2: NEGATIVE

## 2020-01-07 ENCOUNTER — Ambulatory Visit (HOSPITAL_BASED_OUTPATIENT_CLINIC_OR_DEPARTMENT_OTHER)
Admission: RE | Admit: 2020-01-07 | Discharge: 2020-01-07 | Disposition: A | Discharge status: Home / Self Care | Payer: No Typology Code available for payment source | Source: Ambulatory Visit | Attending: General Surgery | Admitting: General Surgery

## 2020-01-07 ENCOUNTER — Ambulatory Visit
Admission: RE | Admit: 2020-01-07 | Discharge: 2020-01-07 | Disposition: A | Discharge status: Home / Self Care | Payer: No Typology Code available for payment source | Attending: General Surgery | Admitting: General Surgery

## 2020-01-07 ENCOUNTER — Ambulatory Visit: Payer: No Typology Code available for payment source | Admitting: Anesthesiology

## 2020-01-07 ENCOUNTER — Other Ambulatory Visit: Payer: Self-pay

## 2020-01-07 ENCOUNTER — Encounter: Payer: Self-pay | Admitting: General Surgery

## 2020-01-07 ENCOUNTER — Ambulatory Visit
Admission: RE | Admit: 2020-01-07 | Discharge: 2020-01-07 | Disposition: A | Discharge status: Home / Self Care | Payer: No Typology Code available for payment source | Source: Ambulatory Visit | Attending: General Surgery | Admitting: General Surgery

## 2020-01-07 ENCOUNTER — Encounter
Admission: RE | Disposition: A | Discharge status: Home / Self Care | Payer: Self-pay | Source: Home / Self Care | Attending: General Surgery

## 2020-01-07 DIAGNOSIS — C50411 Malignant neoplasm of upper-outer quadrant of right female breast: Secondary | ICD-10-CM | POA: Diagnosis present

## 2020-01-07 DIAGNOSIS — Z803 Family history of malignant neoplasm of breast: Secondary | ICD-10-CM | POA: Insufficient documentation

## 2020-01-07 DIAGNOSIS — N6011 Diffuse cystic mastopathy of right breast: Secondary | ICD-10-CM | POA: Insufficient documentation

## 2020-01-07 DIAGNOSIS — E538 Deficiency of other specified B group vitamins: Secondary | ICD-10-CM | POA: Diagnosis not present

## 2020-01-07 DIAGNOSIS — I10 Essential (primary) hypertension: Secondary | ICD-10-CM | POA: Insufficient documentation

## 2020-01-07 DIAGNOSIS — Z79899 Other long term (current) drug therapy: Secondary | ICD-10-CM | POA: Insufficient documentation

## 2020-01-07 DIAGNOSIS — Z170 Estrogen receptor positive status [ER+]: Secondary | ICD-10-CM | POA: Insufficient documentation

## 2020-01-07 DIAGNOSIS — Z79890 Hormone replacement therapy (postmenopausal): Secondary | ICD-10-CM | POA: Insufficient documentation

## 2020-01-07 DIAGNOSIS — Z87891 Personal history of nicotine dependence: Secondary | ICD-10-CM | POA: Diagnosis not present

## 2020-01-07 HISTORY — PX: PARTIAL MASTECTOMY WITH NEEDLE LOCALIZATION AND AXILLARY SENTINEL LYMPH NODE BX: SHX6009

## 2020-01-07 HISTORY — PX: BREAST LUMPECTOMY: SHX2

## 2020-01-07 SURGERY — PARTIAL MASTECTOMY WITH NEEDLE LOCALIZATION AND AXILLARY SENTINEL LYMPH NODE BX
Anesthesia: General | Site: Breast | Laterality: Right

## 2020-01-07 MED ORDER — PROPOFOL 500 MG/50ML IV EMUL
INTRAVENOUS | Status: DC | PRN
Start: 2020-01-07 — End: 2020-01-07
  Administered 2020-01-07: 150 ug/kg/min via INTRAVENOUS

## 2020-01-07 MED ORDER — TECHNETIUM TC 99M SULFUR COLLOID FILTERED
1.0000 | Freq: Once | INTRAVENOUS | Status: AC | PRN
  Administered 2020-01-07: 0.772 via INTRADERMAL

## 2020-01-07 MED ORDER — DEXAMETHASONE SODIUM PHOSPHATE 10 MG/ML IJ SOLN
INTRAMUSCULAR | Status: DC | PRN
  Administered 2020-01-07: 8 mg via INTRAVENOUS

## 2020-01-07 MED ORDER — FENTANYL CITRATE (PF) 100 MCG/2ML IJ SOLN
INTRAMUSCULAR | Status: AC
  Filled 2020-01-07: qty 2

## 2020-01-07 MED ORDER — HYDROCODONE-ACETAMINOPHEN 5-325 MG PO TABS: 1.0000 | ORAL_TABLET | ORAL | 0 refills | Status: AC | PRN

## 2020-01-07 MED ORDER — CHLORHEXIDINE GLUCONATE 0.12 % MT SOLN
OROMUCOSAL | Status: AC
  Administered 2020-01-07: 15 mL via OROMUCOSAL
  Filled 2020-01-07: qty 15

## 2020-01-07 MED ORDER — CHLORHEXIDINE GLUCONATE 0.12 % MT SOLN: 15.0000 mL | Freq: Once | OROMUCOSAL | Status: AC

## 2020-01-07 MED ORDER — ACETAMINOPHEN 10 MG/ML IV SOLN
INTRAVENOUS | Status: DC | PRN
  Administered 2020-01-07: 1000 mg via INTRAVENOUS

## 2020-01-07 MED ORDER — KETOROLAC TROMETHAMINE 30 MG/ML IJ SOLN
INTRAMUSCULAR | Status: AC
  Filled 2020-01-07: qty 1

## 2020-01-07 MED ORDER — ONDANSETRON HCL 4 MG/2ML IJ SOLN
INTRAMUSCULAR | Status: DC | PRN
  Administered 2020-01-07: 4 mg via INTRAVENOUS

## 2020-01-07 MED ORDER — ORAL CARE MOUTH RINSE: 15.0000 mL | Freq: Once | OROMUCOSAL | Status: AC

## 2020-01-07 MED ORDER — FENTANYL CITRATE (PF) 100 MCG/2ML IJ SOLN
INTRAMUSCULAR | Status: AC
  Administered 2020-01-07: 25 ug via INTRAVENOUS
  Filled 2020-01-07: qty 2

## 2020-01-07 MED ORDER — CEFAZOLIN SODIUM-DEXTROSE 2-4 GM/100ML-% IV SOLN
2.0000 g | INTRAVENOUS | Status: AC
  Administered 2020-01-07: 2 g via INTRAVENOUS

## 2020-01-07 MED ORDER — ACETAMINOPHEN 10 MG/ML IV SOLN
INTRAVENOUS | Status: AC
  Filled 2020-01-07: qty 100

## 2020-01-07 MED ORDER — CEFAZOLIN SODIUM-DEXTROSE 2-4 GM/100ML-% IV SOLN
INTRAVENOUS | Status: AC
  Filled 2020-01-07: qty 100

## 2020-01-07 MED ORDER — PROPOFOL 10 MG/ML IV BOLUS
INTRAVENOUS | Status: AC
  Filled 2020-01-07: qty 20

## 2020-01-07 MED ORDER — FENTANYL CITRATE (PF) 100 MCG/2ML IJ SOLN
25.0000 ug | INTRAMUSCULAR | Status: DC | PRN
  Administered 2020-01-07: 25 ug via INTRAVENOUS

## 2020-01-07 MED ORDER — MIDAZOLAM HCL 2 MG/2ML IJ SOLN
INTRAMUSCULAR | Status: DC | PRN
  Administered 2020-01-07: 2 mg via INTRAVENOUS

## 2020-01-07 MED ORDER — LIDOCAINE HCL (CARDIAC) PF 100 MG/5ML IV SOSY
PREFILLED_SYRINGE | INTRAVENOUS | Status: DC | PRN
  Administered 2020-01-07: 50 mg via INTRAVENOUS

## 2020-01-07 MED ORDER — LACTATED RINGERS IV SOLN: INTRAVENOUS | Status: DC

## 2020-01-07 MED ORDER — MIDAZOLAM HCL 2 MG/2ML IJ SOLN
INTRAMUSCULAR | Status: AC
  Filled 2020-01-07: qty 2

## 2020-01-07 MED ORDER — FAMOTIDINE 20 MG PO TABS: 20.0000 mg | ORAL_TABLET | Freq: Once | ORAL | Status: AC

## 2020-01-07 MED ORDER — PROPOFOL 500 MG/50ML IV EMUL
INTRAVENOUS | Status: AC
  Filled 2020-01-07: qty 50

## 2020-01-07 MED ORDER — STERILE WATER FOR IRRIGATION IR SOLN
Status: DC | PRN
  Administered 2020-01-07: 1000 mL

## 2020-01-07 MED ORDER — PROPOFOL 10 MG/ML IV BOLUS
INTRAVENOUS | Status: DC | PRN
  Administered 2020-01-07: 20 mg via INTRAVENOUS
  Administered 2020-01-07: 100 mg via INTRAVENOUS

## 2020-01-07 MED ORDER — ONDANSETRON HCL 4 MG/2ML IJ SOLN: 4.0000 mg | Freq: Once | INTRAMUSCULAR | Status: DC | PRN

## 2020-01-07 MED ORDER — BUPIVACAINE-EPINEPHRINE (PF) 0.5% -1:200000 IJ SOLN
INTRAMUSCULAR | Status: DC | PRN
  Administered 2020-01-07: 30 mL

## 2020-01-07 MED ORDER — FAMOTIDINE 20 MG PO TABS
ORAL_TABLET | ORAL | Status: AC
  Administered 2020-01-07: 20 mg via ORAL
  Filled 2020-01-07: qty 1

## 2020-01-07 MED ORDER — FENTANYL CITRATE (PF) 100 MCG/2ML IJ SOLN
INTRAMUSCULAR | Status: DC | PRN
  Administered 2020-01-07: 25 ug via INTRAVENOUS
  Administered 2020-01-07: 50 ug via INTRAVENOUS
  Administered 2020-01-07: 25 ug via INTRAVENOUS

## 2020-01-07 SURGICAL SUPPLY — 51 items
BINDER BREAST LRG (GAUZE/BANDAGES/DRESSINGS) IMPLANT
BINDER BREAST MEDIUM (GAUZE/BANDAGES/DRESSINGS) IMPLANT
BINDER BREAST XLRG (GAUZE/BANDAGES/DRESSINGS) IMPLANT
BINDER BREAST XXLRG (GAUZE/BANDAGES/DRESSINGS) IMPLANT
BLADE SURG 15 STRL LF DISP TIS (BLADE) ×2 IMPLANT
BLADE SURG 15 STRL SS (BLADE) ×4
CANISTER SUCT 1200ML W/VALVE (MISCELLANEOUS) ×3 IMPLANT
CHLORAPREP W/TINT 26 (MISCELLANEOUS) ×3 IMPLANT
CNTNR SPEC 2.5X3XGRAD LEK (MISCELLANEOUS)
CONT SPEC 4OZ STER OR WHT (MISCELLANEOUS)
CONTAINER SPEC 2.5X3XGRAD LEK (MISCELLANEOUS) IMPLANT
COVER WAND RF STERILE (DRAPES) ×3 IMPLANT
DERMABOND ADVANCED (GAUZE/BANDAGES/DRESSINGS) ×2
DERMABOND ADVANCED .7 DNX12 (GAUZE/BANDAGES/DRESSINGS) ×1 IMPLANT
DEVICE DUBIN SPECIMEN MAMMOGRA (MISCELLANEOUS) ×3 IMPLANT
DRAPE LAPAROTOMY TRNSV 106X77 (MISCELLANEOUS) ×3 IMPLANT
DRSG GAUZE FLUFF 36X18 (GAUZE/BANDAGES/DRESSINGS) IMPLANT
ELECT CAUTERY BLADE 6.4 (BLADE) ×3 IMPLANT
ELECT REM PT RETURN 9FT ADLT (ELECTROSURGICAL) ×3
ELECTRODE REM PT RTRN 9FT ADLT (ELECTROSURGICAL) ×1 IMPLANT
GLOVE BIO SURGEON STRL SZ 6.5 (GLOVE) ×2 IMPLANT
GLOVE BIO SURGEONS STRL SZ 6.5 (GLOVE) ×1
GLOVE BIOGEL PI IND STRL 6.5 (GLOVE) ×1 IMPLANT
GLOVE BIOGEL PI INDICATOR 6.5 (GLOVE) ×2
GOWN STRL REUS W/ TWL LRG LVL3 (GOWN DISPOSABLE) ×2 IMPLANT
GOWN STRL REUS W/TWL LRG LVL3 (GOWN DISPOSABLE) ×4
KIT MARKER MARGIN INK (KITS) ×3 IMPLANT
KIT TURNOVER KIT A (KITS) ×3 IMPLANT
LABEL OR SOLS (LABEL) ×3 IMPLANT
MARGIN MAP 10MM (MISCELLANEOUS) IMPLANT
MARKER MARGIN CORRECT CLIP (MARKER) ×3 IMPLANT
NEEDLE HYPO 22GX1.5 SAFETY (NEEDLE) ×3 IMPLANT
NEEDLE HYPO 25X1 1.5 SAFETY (NEEDLE) ×3 IMPLANT
PACK BASIN MINOR (MISCELLANEOUS) ×3 IMPLANT
RETRACTOR RING XSMALL (MISCELLANEOUS) IMPLANT
RTRCTR WOUND ALEXIS 13CM XS SH (MISCELLANEOUS)
SET LOCALIZER 20 PROBE US (MISCELLANEOUS) ×3 IMPLANT
SLEVE PROBE SENORX GAMMA FIND (MISCELLANEOUS) ×3 IMPLANT
SUT ETHILON 3-0 FS-10 30 BLK (SUTURE) ×3
SUT MNCRL 4-0 (SUTURE) ×4
SUT MNCRL 4-0 27XMFL (SUTURE) ×2
SUT SILK 2 0 SH (SUTURE) ×3 IMPLANT
SUT VIC AB 2-0 SH 27 (SUTURE) ×2
SUT VIC AB 2-0 SH 27XBRD (SUTURE) ×1 IMPLANT
SUT VIC AB 3-0 SH 27 (SUTURE) ×4
SUT VIC AB 3-0 SH 27X BRD (SUTURE) ×2 IMPLANT
SUTURE EHLN 3-0 FS-10 30 BLK (SUTURE) ×1 IMPLANT
SUTURE MNCRL 4-0 27XMF (SUTURE) ×2 IMPLANT
SYR 10ML LL (SYRINGE) ×6 IMPLANT
SYR BULB IRRIG 60ML STRL (SYRINGE) ×3 IMPLANT
WATER STERILE IRR 1000ML POUR (IV SOLUTION) ×3 IMPLANT

## 2020-01-07 NOTE — Interval H&P Note (Signed)
History and Physical Interval Note:  01/07/2020 12:21 PM  Sheri [name]  has presented today for surgery, with the diagnosis of C50.411 Malignant neoplasm of upper-outer quadrant of rt female breast, unspecified estrogen receptor status.  The various methods of treatment have been discussed with the patient and family. After consideration of risks, benefits and other options for treatment, the patient has consented to  Procedure(s): PARTIAL MASTECTOMY WITH Radiofrequency tag AND AXILLARY SENTINEL LYMPH NODE BX (Right) as a surgical intervention.  The patient's history has been reviewed, patient examined, no change in status, stable for surgery.  I have reviewed the patient's chart and labs.  Right chest marked in the pre procedure room. Questions were answered to the patient's satisfaction.     Herbert Pun

## 2020-01-07 NOTE — Anesthesia Postprocedure Evaluation (Signed)
Anesthesia Post Note  Patient: Sheri [name]  Procedure(s) Performed: PARTIAL MASTECTOMY WITH RADIOFREQUENCY TAG, RIGHT AXILLARY SENTINEL LYMPH NODE BIOPSY (Right Breast)  Patient location during evaluation: PACU Anesthesia Type: General Level of consciousness: awake and alert and oriented Pain management: pain level controlled Vital Signs Assessment: post-procedure vital signs reviewed and stable Respiratory status: spontaneous breathing, nonlabored ventilation and respiratory function stable Cardiovascular status: blood pressure returned to baseline and stable Postop Assessment: no signs of nausea or vomiting Anesthetic complications: no   No complications documented.   Last Vitals:  Vitals:   01/07/20 1525 01/07/20 1550  BP: 134/78 130/73  Pulse: 77 72  Resp: (!) 22 18  Temp: (!) 36.3 C 36.5 C  SpO2: 96% 97%    Last Pain:  Vitals:   01/07/20 1550  TempSrc: Temporal  PainSc: 1                  [name]

## 2020-01-07 NOTE — Discharge Instructions (Signed)
°  Diet: Resume home heart healthy regular diet.  ° °Activity: Increase activity as tolerated. Light activity and walking are encouraged. Do not drive or drink alcohol if taking narcotic pain medications. ° °Wound care: May shower with soapy water and pat dry (do not rub incisions), but no baths or submerging incision underwater until follow-up. (no swimming)  ° °Medications: Resume all home medications. For mild to moderate pain: acetaminophen (Tylenol) or ibuprofen (if no kidney disease). Combining Tylenol with alcohol can substantially increase your risk of causing liver disease. Narcotic pain medications, if prescribed, can be used for severe pain, though may cause nausea, constipation, and drowsiness. Do not combine Tylenol and Norco within a 6 hour period as Norco contains Tylenol. If you do not need the narcotic pain medication, you do not need to fill the prescription. ° °Call office ([phone]) at any time if any questions, worsening pain, fevers/chills, bleeding, drainage from incision site, or other concerns. ° °AMBULATORY SURGERY  °DISCHARGE INSTRUCTIONS ° ° °1) The drugs that you were given will stay in your system until tomorrow so for the next 24 hours you should not: ° °A) Drive an automobile °B) Make any legal decisions °C) Drink any alcoholic beverage ° ° °2) You may resume regular meals tomorrow.  Today it is better to start with liquids and gradually work up to solid foods. ° °You may eat anything you prefer, but it is better to start with liquids, then soup and crackers, and gradually work up to solid foods. ° ° °3) Please notify your doctor immediately if you have any unusual bleeding, trouble breathing, redness and pain at the surgery site, drainage, fever, or pain not relieved by medication. ° ° ° °4) Additional Instructions: ° ° ° ° ° ° ° °Please contact your physician with any problems or Same Day Surgery at [phone], Monday through Friday 6 am to 4 pm, or [place] at [place] [facility]  number at [phone]. °

## 2020-01-07 NOTE — Anesthesia Preprocedure Evaluation (Signed)
Anesthesia Evaluation  Patient identified by MRN, date of birth, ID band Patient awake    Reviewed: Allergy & Precautions, NPO status , Patient's Chart, lab work & pertinent test results  History of Anesthesia Complications (+) PONV and history of anesthetic complications  Airway Mallampati: II  TM Distance: >3 FB Neck ROM: Full    Dental  (+) Implants   Pulmonary neg sleep apnea, neg COPD, former smoker,    breath sounds clear to auscultation- rhonchi (-) wheezing      Cardiovascular Exercise Tolerance: Good hypertension, Pt. on medications (-) CAD, (-) Past MI, (-) Cardiac Stents and (-) CABG  Rhythm:Regular Rate:Normal - Systolic murmurs and - Diastolic murmurs    Neuro/Psych neg Seizures negative neurological ROS  negative psych ROS   GI/Hepatic Neg liver ROS, GERD  ,  Endo/Other  negative endocrine ROSneg diabetes  Renal/GU negative Renal ROS     Musculoskeletal negative musculoskeletal ROS (+)   Abdominal (+) - obese,   Peds  Hematology negative hematology ROS (+)   Anesthesia Other Findings Past Medical History: No date: Breast cancer ([facility]) No date: Dysrhythmia     Comment:  tachycardia No date: Family history of breast cancer No date: Family history of prostate cancer No date: GERD (gastroesophageal reflux disease) No date: Hypertension No date: Vitamin D deficiency   Reproductive/Obstetrics                             Anesthesia Physical Anesthesia Plan  ASA: II  Anesthesia Plan: General   Post-op Pain Management:    Induction: Intravenous  PONV Risk Score and Plan: 2 and Midazolam, TIVA and Ondansetron  Airway Management Planned: LMA  Additional Equipment:   Intra-op Plan:   Post-operative Plan:   Informed Consent: I have reviewed the patients History and Physical, chart, labs and discussed the procedure including the risks, benefits and alternatives for  the proposed anesthesia with the patient or authorized representative who has indicated his/her understanding and acceptance.     Dental advisory given  Plan Discussed with: CRNA and Anesthesiologist  Anesthesia Plan Comments:         Anesthesia Quick Evaluation

## 2020-01-07 NOTE — Anesthesia Procedure Notes (Signed)
Procedure Name: LMA Insertion Date/Time: 01/07/2020 1:01 PM Performed by: Johnna Acosta, CRNA Pre-anesthesia Checklist: Patient identified, Emergency Drugs available, Suction available, Patient being monitored and Timeout performed Patient Re-evaluated:Patient Re-evaluated prior to induction Oxygen Delivery Method: Circle system utilized Preoxygenation: Pre-oxygenation with 100% oxygen Induction Type: IV induction LMA: LMA inserted LMA Size: 3.5 Tube type: Oral Number of attempts: 1 Placement Confirmation: positive ETCO2 and breath sounds checked- equal and bilateral Tube secured with: Tape Dental Injury: Teeth and Oropharynx as per pre-operative assessment

## 2020-01-07 NOTE — Op Note (Signed)
Preoperative diagnosis: Right breast carcinoma.  Postoperative diagnosis: Right breast carcinoma.   Procedure: Right radiofrequency tag-localized partial mastectomy.                      Right Axillary Sentinel Lymph node biopsy  Anesthesia: GETA  Surgeon: Dr. Windell Moment  Wound Classification: Clean  Indications: Patient is a 59 y.o. female with a nonpalpable right breast mass noted on mammography with core biopsy demonstrating invasive mammary carcinoma requires radiofrequency tag-localized partial mastectomy for treatment with sentinel lymph node biopsy.   Findings: 1. Specimen mammography shows marker and tag on specimen 2. Pathology call refers gross examination of margins was abutting the posterior margin 3. No other palpable mass or lymph node identified.   Description of procedure: Preoperative radiofrequency tag localization was performed by radiology. In the nuclear medicine suite, the subareolar region was injected with Tc-99 sulfur colloid. Localization studies were reviewed. The patient was taken to the operating room and placed supine on the operating table, and after general anesthesia the right chest and axilla were prepped and draped in the usual sterile fashion. A time-out was completed verifying correct patient, procedure, site, positioning, and implant(s) and/or special equipment prior to beginning this procedure.  By comparing the localization studies and interrogation with Localizer device, the probable trajectory and location of the mass was visualized. A circumareolar skin incision was planned in such a way as to minimize the amount of dissection to reach the mass.  The skin incision was made. Flaps were raised and the location of the tag was confirmed with Localizer device confirmed. A 2-0 silk figure-of-eight stay suture was placed and used for retraction. Dissection was then taken down circumferentially, taking care to include the entire localizing tag and a wide  margin of grossly normal tissue. The specimen and entire localizing tag were removed. The specimen was oriented and sent to radiology with the localization studies. Confirmation was received that the entire target lesion had been resected. The wound was irrigated. Hemostasis was checked. The wound was closed with interrupted sutures of 3-0 Vicryl and a subcuticular suture of Monocryl 3-0. No attempt was made to close the dead space.   A hand-held gamma probe was used to identify the location of the hottest spot in the axilla. From the same partial mastectomy incision, dissection was carried down until subdermal facias was advanced. The probe was placed and again, the point of maximal count was found. Dissection continue until nodule was identified. The probe was placed in contact with the node. The node was excised in its entirety.  Two additional hot spot was detected and the nodes were excised in similar fashion. No additional hot spots were identified. No clinically abnormal nodes were palpated. The procedure was terminated. Hemostasis was achieved and the wound closed in layers with deep interrupted 3-0 Vicryl and skin was closed with subcuticular suture of Monocryl 3-0.  The patient tolerated the procedure well and was taken to the postanesthesia care unit in stable condition.   Specimen: Right Breast mass                     Sentinel Lymph nodes #1, #2, #3                    Posterior margin  Complications: None  Estimated Blood Loss: 5 mL

## 2020-01-07 NOTE — Transfer of Care (Signed)
Immediate Anesthesia Transfer of Care Note  Patient: MARYLN EASTHAM  Procedure(s) Performed: PARTIAL MASTECTOMY WITH RADIOFREQUENCY TAG, RIGHT AXILLARY SENTINEL LYMPH NODE BIOPSY (Right Breast)  Patient Location: PACU  Anesthesia Type:General  Level of Consciousness: awake, alert  and oriented  Airway & Oxygen Therapy: Patient Spontanous Breathing and Patient connected to nasal cannula oxygen  Post-op Assessment: Report given to RN and Post -op Vital signs reviewed and stable  Post vital signs: Reviewed and stable  Last Vitals:  Vitals Value Taken Time  BP 125/73 01/07/20 1441  Temp 36.1 C 01/07/20 1441  Pulse 79 01/07/20 1445  Resp 18 01/07/20 1445  SpO2 100 % 01/07/20 1445  Vitals shown include unvalidated device data.  Last Pain:  Vitals:   01/07/20 0939  TempSrc: Tympanic  PainSc: 0-No pain      Patients Stated Pain Goal: 0 (22/63/33 [id number])  Complications: No complications documented.

## 2020-01-08 ENCOUNTER — Encounter: Payer: Self-pay | Admitting: *Deleted

## 2020-01-08 ENCOUNTER — Encounter: Payer: Self-pay | Admitting: General Surgery

## 2020-01-08 NOTE — Progress Notes (Signed)
Called patient to follow up post surgery.  States she is doing well, just a little sore.  States she did not get her blood work drawn for her genetic testing when she was here last week.  Informed her we can get it drawn when she comes in for folllow up with Dr. Janese Banks next week.  Message sent to Dr. Janese Banks and team.  No needs at this time.

## 2020-01-09 ENCOUNTER — Encounter: Payer: Self-pay | Admitting: *Deleted

## 2020-01-09 NOTE — Progress Notes (Signed)
Called and informed patient of her appointment for her blood draw for genetic testing on 01/15/20 @ 9:30.

## 2020-01-11 ENCOUNTER — Other Ambulatory Visit: Payer: Self-pay | Admitting: Anatomic Pathology & Clinical Pathology

## 2020-01-11 LAB — SURGICAL PATHOLOGY

## 2020-01-15 ENCOUNTER — Encounter: Payer: Self-pay | Admitting: *Deleted

## 2020-01-15 ENCOUNTER — Encounter: Payer: Self-pay | Admitting: Oncology

## 2020-01-15 ENCOUNTER — Inpatient Hospital Stay: Payer: No Typology Code available for payment source

## 2020-01-15 ENCOUNTER — Inpatient Hospital Stay (HOSPITAL_BASED_OUTPATIENT_CLINIC_OR_DEPARTMENT_OTHER): Payer: No Typology Code available for payment source | Admitting: Oncology

## 2020-01-15 ENCOUNTER — Ambulatory Visit
Admission: RE | Admit: 2020-01-15 | Discharge: 2020-01-15 | Disposition: A | Discharge status: Home / Self Care | Payer: No Typology Code available for payment source | Source: Ambulatory Visit | Attending: Radiation Oncology | Admitting: Radiation Oncology

## 2020-01-15 ENCOUNTER — Other Ambulatory Visit: Payer: Self-pay

## 2020-01-15 VITALS — BP 130/71 | HR 65 | Temp 96.2°F | Wt 134.7 lb

## 2020-01-15 DIAGNOSIS — Z7189 Other specified counseling: Secondary | ICD-10-CM | POA: Diagnosis not present

## 2020-01-15 DIAGNOSIS — C50919 Malignant neoplasm of unspecified site of unspecified female breast: Secondary | ICD-10-CM

## 2020-01-15 DIAGNOSIS — C50411 Malignant neoplasm of upper-outer quadrant of right female breast: Secondary | ICD-10-CM | POA: Diagnosis not present

## 2020-01-15 DIAGNOSIS — Z170 Estrogen receptor positive status [ER+]: Secondary | ICD-10-CM

## 2020-01-15 NOTE — Consult Note (Signed)
NEW PATIENT EVALUATION  Name: Sheri [name]  MRN: [id number]  Date:   01/15/2020     DOB: Oct 17, 1960   This 59 y.o. female patient presents to the clinic for initial evaluation of stage Ia (T1b N0 M0) ER/PR positive HER-2 negative invasive mammary carcinoma the right breast status post wide local excision and sentinel node biopsy  REFERRING PHYSICIAN: Rusty Aus, MD  CHIEF COMPLAINT:  Chief Complaint  Patient presents with  . Breast Cancer    Initial consultation    DIAGNOSIS: The encounter diagnosis was Invasive carcinoma of breast ([place]).   PREVIOUS INVESTIGATIONS:  Mammogram ultrasound reviewed Pathology report reviewed Clinical notes reviewed  HPI: Patient is a 59 year old female who presented with an abnormal mammogram of the right breast showing a lesion in the upper outer quadrant at the 10 o'clock position 7 cm from the nipple.  This was confirmed on ultrasound to be hypoechoic mass measuring approximately 0.9 cm in greatest dimension.  Ultrasound-guided biopsy was performed showing invasive mammary carcinoma.  She underwent wide local excision for an 8 mm overall grade 1 invasive mammary carcinoma strongly ER positive PR positive HER-2/neu not overexpressed.  Margins were clear at 3 mm for the invasive component as well as 5 mm for DCIS.  4 lymph lymph nodes were identified and all negative for metastatic disease.  Oncotype DX has been ordered and is pending.  She is seen today for consultation and is doing well.  She specifically denies breast tenderness cough or bone pain.  PLANNED TREATMENT REGIMEN: Right breast hypofractionated radiation therapy  PAST MEDICAL HISTORY:  has a past medical history of Breast cancer ([place]), Dysrhythmia, Family history of breast cancer, Family history of prostate cancer, GERD (gastroesophageal reflux disease), Hypertension, and Vitamin D deficiency.    PAST SURGICAL HISTORY:  Past Surgical History:  Procedure Laterality Date  . ABDOMINAL  HYSTERECTOMY     complete  . BREAST BIOPSY Left 2013   negative core  . BREAST BIOPSY Right 2021   INVASIVE MAMMARY CARCINOMA Grade 1. Ductal carcinoma in situ: Present, low-grade  . BREAST EXCISIONAL BIOPSY Right 01/01/2020   RF tag placement [id number]  . CESAREAN SECTION    . INCONTINENCE SURGERY    . NASAL SINUS SURGERY Left 2010  . PARTIAL MASTECTOMY WITH NEEDLE LOCALIZATION AND AXILLARY SENTINEL LYMPH NODE BX Right 01/07/2020   Procedure: PARTIAL MASTECTOMY WITH RADIOFREQUENCY TAG, RIGHT AXILLARY SENTINEL LYMPH NODE BIOPSY;  Surgeon: Herbert Pun, MD;  Location: [facility] ORS;  Service: General;  Laterality: Right;  . TONSILLECTOMY      FAMILY HISTORY: family history includes Breast cancer in her maternal grandmother; Cancer in her paternal grandmother; Prostate cancer in her brother and father.  SOCIAL HISTORY:  reports that she has quit smoking. She has never used smokeless tobacco. She reports previous alcohol use. She reports that she does not use drugs.  ALLERGIES: Tape  MEDICATIONS:  Current Outpatient Medications  Medication Sig Dispense Refill  . acetaminophen (TYLENOL) 500 MG tablet Take 500-1,000 mg by mouth every 6 (six) hours as needed (for pain.).    Marland Kitchen B Complex Vitamins (VITAMIN B COMPLEX PO) Take 1 tablet by mouth once a week.    . calcium carbonate (TUMS - DOSED IN MG ELEMENTAL CALCIUM) 500 MG chewable tablet Chew 1-2 tablets by mouth daily as needed for indigestion or heartburn.    Marland Kitchen CALCIUM PO Take 1 tablet by mouth once a week. (Patient not taking: Reported on 01/15/2020)    . Cholecalciferol 25  MCG (1000 UT) tablet Take 1,000 Units by mouth daily.     . hydrochlorothiazide (MICROZIDE) 12.5 MG capsule Take 12.5 mg by mouth daily as needed (elevated blood pressure/fluid retention.).     Marland Kitchen metoprolol succinate (TOPROL-XL) 25 MG 24 hr tablet Take 12.5 mg by mouth daily as needed (elevated blood pressure.).     Marland Kitchen traZODone (DESYREL) 50 MG tablet Take 25 mg by mouth  every other day. At night     No current facility-administered medications for this encounter.    ECOG PERFORMANCE STATUS:  0 - Asymptomatic  REVIEW OF SYSTEMS: Patient denies any weight loss, fatigue, weakness, fever, chills or night sweats. Patient denies any loss of vision, blurred vision. Patient denies any ringing  of the ears or hearing loss. No irregular heartbeat. Patient denies heart murmur or history of fainting. Patient denies any chest pain or pain radiating to her upper extremities. Patient denies any shortness of breath, difficulty breathing at night, cough or hemoptysis. Patient denies any swelling in the lower legs. Patient denies any nausea vomiting, vomiting of blood, or coffee ground material in the vomitus. Patient denies any stomach pain. Patient states has had normal bowel movements no significant constipation or diarrhea. Patient denies any dysuria, hematuria or significant nocturia. Patient denies any problems walking, swelling in the joints or loss of balance. Patient denies any skin changes, loss of hair or loss of weight. Patient denies any excessive worrying or anxiety or significant depression. Patient denies any problems with insomnia. Patient denies excessive thirst, polyuria, polydipsia. Patient denies any swollen glands, patient denies easy bruising or easy bleeding. Patient denies any recent infections, allergies or URI. Patient "s visual fields have not changed significantly in recent time.   PHYSICAL EXAM: There were no vitals taken for this visit. Right breast is wide local excision scar which is healing well.  No dominant mass or nodularity is noted in either breast in 2 positions examined.  No axillary or supraclavicular adenopathy is appreciated.  Well-developed well-nourished patient in NAD. HEENT reveals PERLA, EOMI, discs not visualized.  Oral cavity is clear. No oral mucosal lesions are identified. Neck is clear without evidence of cervical or supraclavicular  adenopathy. Lungs are clear to A&P. Cardiac examination is essentially unremarkable with regular rate and rhythm without murmur rub or thrill. Abdomen is benign with no organomegaly or masses noted. Motor sensory and DTR levels are equal and symmetric in the upper and lower extremities. Cranial nerves II through XII are grossly intact. Proprioception is intact. No peripheral adenopathy or edema is identified. No motor or sensory levels are noted. Crude visual fields are within normal range.  LABORATORY DATA: Pathology report reviewed    RADIOLOGY RESULTS: Mammogram and ultrasound reviewed compatible with above-stated findings   IMPRESSION: Stage Ia invasive mammary carcinoma of the right breast status post wide local excision and sentinel node biopsy in 59 year old female  PLAN: At this time I have recommended hypofractionated course of radiation therapy over 3 weeks.  Would also boost her scar another 1000 cGy using electron beam treatment.  Risks and benefits of treatment including skin reaction fatigue alteration of blood counts possible inclusion of superficial lung all were discussed in detail.  Patient also will benefit after radiation from antiestrogen therapy.  I have delayed her simulation till next week hopefully will have an Oncotype DX back in the next 2 weeks prior to [place] initiating treatment.  I have personally set up and ordered CT simulation and that at that time.  Patient comprehends my recommendations well.  I would like to take this opportunity to thank you for allowing me to participate in the care of your patient.Noreene Filbert, MD

## 2020-01-18 NOTE — Progress Notes (Signed)
Hematology/Oncology Consult note [facility]  [phone] [phone]  Patient Care Team: Rusty Aus, MD as PCP - General (Internal Medicine) Rico Junker, RN as Registered Nurse   Name of the patient: Sheri [name]  [id number]  May 10, 1961   Date of visit: 01/18/20  Diagnosis-pathological prognostic stage Ia right breast cancer mpT1b pN0 cM0 ER/PR positive HER-2/neu negative s/p lumpectomy  Chief complaint/ Reason for visit-discuss pathology results and further management  Heme/Onc history: Patient is a 59 year old female with a past medical history significant for hypertension who recently underwent a screening mammogram on 10/18/2019 which showed a possible mass in the right breast.  This was followed by a diagnostic mammogram and ultrasound which showed hypoechoic mass measuring 0.8 x 0.8 x 0.9 cm at 10 o'clock position in the right breast 7 cm from the nipple.  Right axilla demonstrates normal-appearing lymph nodes.  This was biopsied and was consistent with invasive mammary carcinoma grade 1, 8 mm ER 91 -100% positive, PR 11 to 20% positive and HER-2/neu negative.   Patient is doing well for her age and denies any complaints at this time.  Menarche at the age of 6.  She used hormone replacement therapy for a few years in the past.  G2, P2 L2.  No prior breast biopsies.  Age at first pregnancy 28 years.  Menopause in her 80s.  Final pathology showed 2 separate foci of tumor 5 mm and 8 mm in size.  No malignancy in the intervening tissue.  4 sentinel lymph nodes negative for malignancy.  Margins negative.  Tumor was overall grade 1 ER/PR positive and HER-2 negative   Interval history-patient is healing well's post lumpectomy.  She denies any complaints at this time.  She will be seeing radiation oncology today  ECOG PS- 0 Pain scale- 0  Review of systems- Review of Systems  Constitutional: Negative for chills, fever,  malaise/fatigue and weight loss.  HENT: Negative for congestion, ear discharge and nosebleeds.   Eyes: Negative for blurred vision.  Respiratory: Negative for cough, hemoptysis, sputum production, shortness of breath and wheezing.   Cardiovascular: Negative for chest pain, palpitations, orthopnea and claudication.  Gastrointestinal: Negative for abdominal pain, blood in stool, constipation, diarrhea, heartburn, melena, nausea and vomiting.  Genitourinary: Negative for dysuria, flank pain, frequency, hematuria and urgency.  Musculoskeletal: Negative for back pain, joint pain and myalgias.  Skin: Negative for rash.  Neurological: Negative for dizziness, tingling, focal weakness, seizures, weakness and headaches.  Endo/Heme/Allergies: Does not bruise/bleed easily.  Psychiatric/Behavioral: Negative for depression and suicidal ideas. The patient does not have insomnia.       Allergies  Allergen Reactions  . Tape Other (See Comments)    Tears skin/redness (PAPER TAPE OK and tegaderm)_     Past Medical History:  Diagnosis Date  . Breast cancer ([place])   . Dysrhythmia    tachycardia  . Family history of breast cancer   . Family history of prostate cancer   . GERD (gastroesophageal reflux disease)   . Hypertension   . Vitamin D deficiency      Past Surgical History:  Procedure Laterality Date  . ABDOMINAL HYSTERECTOMY     complete  . BREAST BIOPSY Left 2013   negative core  . BREAST BIOPSY Right 2021   INVASIVE MAMMARY CARCINOMA Grade 1. Ductal carcinoma in situ: Present, low-grade  . BREAST EXCISIONAL BIOPSY Right 01/01/2020   RF tag placement [id number]  . CESAREAN SECTION    .  INCONTINENCE SURGERY    . NASAL SINUS SURGERY Left 2010  . PARTIAL MASTECTOMY WITH NEEDLE LOCALIZATION AND AXILLARY SENTINEL LYMPH NODE BX Right 01/07/2020   Procedure: PARTIAL MASTECTOMY WITH RADIOFREQUENCY TAG, RIGHT AXILLARY SENTINEL LYMPH NODE BIOPSY;  Surgeon: Herbert Pun, MD;  Location: [facility]  ORS;  Service: General;  Laterality: Right;  . TONSILLECTOMY      Social History   Socioeconomic History  . Marital status: Divorced    Spouse name: Not on file  . Number of children: Not on file  . Years of education: Not on file  . Highest education level: Not on file  Occupational History  . Not on file  Tobacco Use  . Smoking status: Former Research scientist (life sciences)  . Smokeless tobacco: Never Used  . Tobacco comment: 33 years ago  Vaping Use  . Vaping Use: Never used  Substance and Sexual Activity  . Alcohol use: Not Currently  . Drug use: Never  . Sexual activity: Not on file  Other Topics Concern  . Not on file  Social History Narrative  . Not on file   Social Determinants of Health   Financial Resource Strain:   . Difficulty of Paying Living Expenses:   Food Insecurity:   . Worried About Charity fundraiser in the Last Year:   . Arboriculturist in the Last Year:   Transportation Needs:   . Film/video editor (Medical):   Marland Kitchen Lack of Transportation (Non-Medical):   Physical Activity:   . Days of Exercise per Week:   . Minutes of Exercise per Session:   Stress:   . Feeling of Stress :   Social Connections:   . Frequency of Communication with Friends and Family:   . Frequency of Social Gatherings with Friends and Family:   . Attends Religious Services:   . Active Member of Clubs or Organizations:   . Attends Archivist Meetings:   Marland Kitchen Marital Status:   Intimate Partner Violence:   . Fear of Current or Ex-Partner:   . Emotionally Abused:   Marland Kitchen Physically Abused:   . Sexually Abused:     Family History  Problem Relation Age of Onset  . Breast cancer Maternal Grandmother        dx late 60s  . Prostate cancer Father        dx 71s  . Prostate cancer Brother        dx 40s-50s  . Cancer Paternal Grandmother        tumor on ureter dx 80s     Current Outpatient Medications:  .  acetaminophen (TYLENOL) 500 MG tablet, Take 500-1,000 mg by mouth every 6 (six)  hours as needed (for pain.)., Disp: , Rfl:  .  B Complex Vitamins (VITAMIN B COMPLEX PO), Take 1 tablet by mouth once a week., Disp: , Rfl:  .  calcium carbonate (TUMS - DOSED IN MG ELEMENTAL CALCIUM) 500 MG chewable tablet, Chew 1-2 tablets by mouth daily as needed for indigestion or heartburn., Disp: , Rfl:  .  Cholecalciferol 25 MCG (1000 UT) tablet, Take 1,000 Units by mouth daily. , Disp: , Rfl:  .  hydrochlorothiazide (MICROZIDE) 12.5 MG capsule, Take 12.5 mg by mouth daily as needed (elevated blood pressure/fluid retention.). , Disp: , Rfl:  .  metoprolol succinate (TOPROL-XL) 25 MG 24 hr tablet, Take 12.5 mg by mouth daily as needed (elevated blood pressure.). , Disp: , Rfl:  .  traZODone (DESYREL) 50 MG tablet, Take 25 mg  by mouth every other day. At night, Disp: , Rfl:  .  CALCIUM PO, Take 1 tablet by mouth once a week. (Patient not taking: Reported on 01/15/2020), Disp: , Rfl:   Physical exam:  Vitals:   01/15/20 1008  BP: 130/71  Pulse: 65  Temp: (!) 96.2 F (35.7 C)  TempSrc: Tympanic  SpO2: 100%  Weight: 134 lb 11.2 oz (61.1 kg)   Physical Exam Constitutional:      General: She is not in acute distress. Skin:    General: Skin is warm and dry.  Neurological:     Mental Status: She is alert and oriented to person, place, and time.   Patient is s/p right lumpectomy for surgical scar that is healing well.  No concerning signs and symptoms of infection  No flowsheet data found. No flowsheet data found.  No images are attached to the encounter.  NM SENTINEL NODE INJECTION  Result Date: 01/07/2020 CLINICAL DATA:  Right breast cancer. EXAM: NUCLEAR MEDICINE BREAST LYMPHOSCINTIGRAPHY TECHNIQUE: Intradermal injection of radiopharmaceutical was performed at the 12 o'clock, 3 o'clock, 6 o'clock, and 9 o'clock positions around the right nipple. The patient was then sent to the operating room where the sentinel node(s) were identified and removed by the surgeon.  RADIOPHARMACEUTICALS:  Total of 1 mCi Millipore-filtered Technetium-30msulfur colloid, injected in four aliquots of 0.25 mCi each. IMPRESSION: Uncomplicated intradermal injection of a total of 1 mCi Technetium-935mulfur colloid for purposes of sentinel node identification. Electronically Signed   By: JoSandi Mariscal.D.   On: 01/07/2020 09:13   MM Breast Surgical Specimen  Result Date: 01/07/2020 CLINICAL DATA:  Patient status post right breast lumpectomy. EXAM: SPECIMEN RADIOGRAPH OF THE RIGHT BREAST COMPARISON:  Previous exam(s). FINDINGS: Status post excision of the right breast. The tag and biopsy marker clip are present and completely intact. IMPRESSION: Specimen radiograph of the right breast. Electronically Signed   By: DrLovey Newcomer.D.   On: 01/07/2020 13:38   MM RT RADIO FREQUENCY TAG LOC MAMMO GUIDE  Result Date: 01/01/2020 CLINICAL DATA:  [age]ear old patient recently diagnosed with invasive mammary carcinoma of the right breast following an ultrasound-guided biopsy of a mass in the 10 o'clock position. The heart shaped biopsy clip placed at the time of biopsy is satisfactorily positioned. EXAM: MAMMOGRAPHIC GUIDED RADIOFREQUENCY DEVICE LOCALIZATION OF THE RIGHTBREAST COMPARISON:  Previous exam(s) FINDINGS: Patient presents for radiofrequency device localization prior to lumpectomy. I met with the patient and we discussed the procedure of radiofrequency device localization including benefits and alternatives. We discussed the high likelihood of a successful procedure. We discussed the risks of the procedure including infection, bleeding, tissue injury and further surgery. Informed, written consent was given. The usual time-out protocol was performed immediately prior to the procedure. Using mammographic guidance, sterile technique, 1% lidocaine as local anesthesia, a radiofrequency tag was used to localize a mass with heart shaped biopsy clip using a lateral approach. The follow-up mammogram images  confirm that the RF device is in the expected location and are marked for Dr. CiWindell MomentFollow-up survey of the patient confirms the presence of the RF device. The patient tolerated the procedure well. IMPRESSION: Radiofrequency device localization of the right breast. No apparent complications. Electronically Signed   By: SuCurlene Dolphin.D.   On: 01/01/2020 16:16     Assessment and plan- Patient is a [age].o. female with newly diagnosed invasive mammary carcinoma of the right breast pathological prognostic stage Ia p T1b p N0 centimeters 0 ER/PR  positive HER-2/neu negative s/p lumpectomy here to discuss final pathology results and further management  Patient had an 8 mm grade 1 tumor noted on biopsy.  On final pathology she had 2 separate foci of 1 5 mm tumor and the second 1 was 8 mm grade 1 ER/PR positive HER-2 negative tumor with negative margins.  I would recommend Oncotype testing to determine if she would benefit from adjuvant chemotherapy.  With a grade 1 strongly ER/PR positive the likelihood of she requiring chemotherapy is very low.  We will call the patient after the Oncotype results are back. In the meanwhile she is seeing Dr. Donella Stade today to discuss adjuvant radiation treatment.  Given that her tumor was ER PR positive hormone therapy is indicated at this time.  Idiscussed the role for hormone therapy. Given that she is postmenopausal I would favor 5 years of adjuvant hormone therapy with aromatase inhibitor. I discussed the risks and benefits of Arimidex including all but not limited to fatigue, hypercholesterolemia, hot flashes, arthralgias and worsening bone health.  Patient will also need to be on calcium 1200 mg along with vitamin D 800 international units.  We will obtain a baseline bone density scan written information about Arimidex given to the patient. I would like her to finish radiation therapy and start hormone therapy thereafter. Patient verbalized understanding and agrees to  proceed.  This is pending results of Oncotype test.  If Oncotype test shows high risk score of 26 or higher she would benefit from adjuvant chemotherapy and I will see her sooner to discuss that.  Treatment will be given with a curative intent    I will tentatively see her back in 4 months to see how she is tolerating hormone therapy.  I will also obtain a bone density scan prior  Cancer Staging Invasive carcinoma of breast ([place]) Staging form: Breast, AJCC 8th Edition - Clinical stage from 12/18/2019: Stage IA (cT1b, cN0, cM0, G1, ER+, PR+, HER2-) - Signed by Sindy Guadeloupe, MD on 12/25/2019 - Pathologic stage from 01/18/2020: Stage IA (pT1b, pN0, cM0, G1, ER+, PR+, HER2-) - Signed by Sindy Guadeloupe, MD on 01/18/2020     Visit Diagnosis 1. Invasive carcinoma of breast ([place])   2. Malignant neoplasm of upper-outer quadrant of right breast in female, estrogen receptor positive ([place])   3. Goals of care, counseling/discussion      Dr. Randa Evens, MD, MPH [facility] at [facility] [phone] 01/18/2020 12:41 PM

## 2020-01-22 ENCOUNTER — Ambulatory Visit
Admission: RE | Admit: 2020-01-22 | Discharge: 2020-01-22 | Disposition: A | Discharge status: Home / Self Care | Payer: No Typology Code available for payment source | Source: Ambulatory Visit | Attending: Radiation Oncology | Admitting: Radiation Oncology

## 2020-01-22 DIAGNOSIS — C50411 Malignant neoplasm of upper-outer quadrant of right female breast: Secondary | ICD-10-CM | POA: Insufficient documentation

## 2020-01-22 DIAGNOSIS — Z170 Estrogen receptor positive status [ER+]: Secondary | ICD-10-CM | POA: Diagnosis not present

## 2020-01-23 ENCOUNTER — Telehealth: Payer: Self-pay | Admitting: Licensed Clinical Social Worker

## 2020-01-23 DIAGNOSIS — C50411 Malignant neoplasm of upper-outer quadrant of right female breast: Secondary | ICD-10-CM | POA: Diagnosis not present

## 2020-01-24 ENCOUNTER — Encounter: Payer: Self-pay | Admitting: Oncology

## 2020-01-24 ENCOUNTER — Other Ambulatory Visit: Payer: Self-pay | Admitting: Oncology

## 2020-01-24 MED ORDER — ANASTROZOLE 1 MG PO TABS
1.0000 mg | ORAL_TABLET | Freq: Every day | ORAL | 3 refills | Status: DC
Start: 2020-01-24 — End: 2020-07-17

## 2020-01-25 ENCOUNTER — Encounter: Payer: Self-pay | Admitting: Licensed Clinical Social Worker

## 2020-01-25 ENCOUNTER — Ambulatory Visit: Payer: Self-pay | Admitting: Licensed Clinical Social Worker

## 2020-01-25 DIAGNOSIS — Z1379 Encounter for other screening for genetic and chromosomal anomalies: Secondary | ICD-10-CM | POA: Insufficient documentation

## 2020-01-25 DIAGNOSIS — C50919 Malignant neoplasm of unspecified site of unspecified female breast: Secondary | ICD-10-CM

## 2020-01-25 DIAGNOSIS — Z803 Family history of malignant neoplasm of breast: Secondary | ICD-10-CM

## 2020-01-25 DIAGNOSIS — Z8042 Family history of malignant neoplasm of prostate: Secondary | ICD-10-CM

## 2020-01-25 NOTE — Telephone Encounter (Signed)
Revealed negative genetic testing. This normal result is reassuring and indicates that it is unlikely Sheri [name]'s cancer is due to a hereditary cause.  It is unlikely that there is an increased risk of another cancer due to a mutation in one of these genes.  However, genetic testing is not perfect, and cannot definitively rule out a hereditary cause.  It will be important for her to keep in contact with genetics to learn if any additional testing may be needed in the future.

## 2020-01-25 NOTE — Progress Notes (Signed)
HPI:  Sheri [name] was previously seen in the [facility] clinic due to a personal and family history of cancer and concerns regarding a hereditary predisposition to cancer. Please refer to our prior cancer genetics clinic note for more information regarding our discussion, assessment and recommendations, at the time. [name] recent genetic test results were disclosed to her, as were recommendations warranted by these results. These results and recommendations are discussed in more detail below.  CANCER HISTORY:  Oncology History  Invasive carcinoma of breast ([place])  12/18/2019 Cancer Staging   Staging form: Breast, AJCC 8th Edition - Clinical stage from 12/18/2019: Stage IA (cT1b, cN0, cM0, G1, ER+, PR+, HER2-) - Signed by Sindy Guadeloupe, MD on 12/25/2019   12/25/2019 Initial Diagnosis   Invasive carcinoma of breast ([place])   01/18/2020 Cancer Staging   Staging form: Breast, AJCC 8th Edition - Pathologic stage from 01/18/2020: Stage IA (pT1b, pN0, cM0, G1, ER+, PR+, HER2-) - Signed by Sindy Guadeloupe, MD on 01/18/2020    Genetic Testing   Negative genetic testing. No pathogenic variants identified on the [facility] Common Hereditary Cancers Panel. The report date is 01/22/2020.  The Common Hereditary Cancers Panel offered by [facility] includes sequencing and/or deletion duplication testing of the following 48 genes: APC, ATM, AXIN2, BARD1, BMPR1A, BRCA1, BRCA2, BRIP1, CDH1, CDKN2A (p14ARF), CDKN2A (p16INK4a), CKD4, CHEK2, CTNNA1, DICER1, EPCAM (Deletion/duplication testing only), GREM1 (promoter region deletion/duplication testing only), KIT, MEN1, MLH1, MSH2, MSH3, MSH6, MUTYH, NBN, NF1, NHTL1, PALB2, PDGFRA, PMS2, POLD1, POLE, PTEN, RAD50, RAD51C, RAD51D, RNF43, SDHB, SDHC, SDHD, SMAD4, SMARCA4. STK11, TP53, TSC1, TSC2, and VHL.  The following genes were evaluated for sequence changes only: SDHA and HOXB13 c.251G>A variant only.     FAMILY HISTORY:  We obtained a detailed, 4-generation  family history.  Significant diagnoses are listed below: Family History  Problem Relation Age of Onset  . Breast cancer Maternal Grandmother        dx late 60s  . Prostate cancer Father        dx 70s  . Prostate cancer Brother        dx 40s-50s  . Cancer Paternal Grandmother        tumor on ureter dx 48s   [name] has 2 daughters, ages 26 and 33. She has a brother, 10, who had prostate cancer in his late 93s-50s. She also has a sister who has not had cancer.  [name] mother died at 61, no cancers. Patient does not have maternal aunts/uncles/first cousins. Maternal grandmother had breast cancer in her late 44s, unsure age of death. Maternal grandfather died at 60 in a tractor accident.   [name] father was diagnosed with prostate and bladder cancer in his 82s and died at 76. Patient does not have paternal aunts/uncles/first cousins. Paternal grandmother had cancer on her ureter, possibly kidney cancer, in her 17s. Grandfather died in his 29s-50s due to heart issues.   [name] is unaware of previous family history of genetic testing for hereditary cancer risks. Patient's ancestors are of [place] and [place] descent. There is no reported Ashkenazi Jewish ancestry. There is no known consanguinity.   GENETIC TEST RESULTS: Genetic testing reported out on 6/292021 through the [facility] Common Hereditary cancer panel found no pathogenic mutations.   The Common Hereditary Cancers Panel offered by [facility] includes sequencing and/or deletion duplication testing of the following 48 genes: APC, ATM, AXIN2, BARD1, BMPR1A, BRCA1, BRCA2, BRIP1, CDH1, CDKN2A (p14ARF), CDKN2A (p16INK4a), CKD4, CHEK2, CTNNA1,  DICER1, EPCAM (Deletion/duplication testing only), GREM1 (promoter region deletion/duplication testing only), KIT, MEN1, MLH1, MSH2, MSH3, MSH6, MUTYH, NBN, NF1, NHTL1, PALB2, PDGFRA, PMS2, POLD1, POLE, PTEN, RAD50, RAD51C, RAD51D, RNF43, SDHB, SDHC, SDHD, SMAD4, SMARCA4. STK11, TP53,  TSC1, TSC2, and VHL.  The following genes were evaluated for sequence changes only: SDHA and HOXB13 c.251G>A variant only.  The test report has been scanned into EPIC and is located under the Molecular Pathology section of the Results Review tab.  A portion of the result report is included below for reference.     We discussed with [name] that because current genetic testing is not perfect, it is possible there may be a gene mutation in one of these genes that current testing cannot detect, but that chance is small.  We also discussed, that there could be another gene that has not yet been discovered, or that we have not yet tested, that is responsible for the cancer diagnoses in the family. It is also possible there is a hereditary cause for the cancer in the family that [name] did not inherit and therefore was not identified in her testing.  Therefore, it is important to remain in touch with cancer genetics in the future so that we can continue to offer [name] the most up to date genetic testing.   ADDITIONAL GENETIC TESTING: We discussed with [name] that her genetic testing was fairly extensive.  If there are genes identified to increase cancer risk that can be analyzed in the future, we would be happy to discuss and coordinate this testing at that time.    CANCER SCREENING RECOMMENDATIONS: [name] test result is considered negative (normal).  This means that we have not identified a hereditary cause for her  personal and family history of cancer at this time. Most cancers happen by chance and this negative test suggests that her cancer may fall into this category.    While reassuring, this does not definitively rule out a hereditary predisposition to cancer. It is still possible that there could be genetic mutations that are undetectable by current technology. There could be genetic mutations in genes that have not been tested or identified to increase cancer risk.   Therefore, it is recommended she continue to follow the cancer management and screening guidelines provided by her oncology and primary healthcare provider.   An individual's cancer risk and medical management are not determined by genetic test results alone. Overall cancer risk assessment incorporates additional factors, including personal medical history, family history, and any available genetic information that may result in a personalized plan for cancer prevention and surveillance.  RECOMMENDATIONS FOR FAMILY MEMBERS:  Relatives in this family might be at some increased risk of developing cancer, over the general population risk, simply due to the family history of cancer.  We recommended female relatives in this family have a yearly mammogram beginning at age 31, or 31 years younger than the earliest onset of cancer, an annual clinical breast exam, and perform monthly breast self-exams. Female relatives in this family should also have a gynecological exam as recommended by their primary provider.  All family members should be referred for colonoscopy starting at age 3.   It is also possible there is a hereditary cause for the cancer in [name]'s family that she did not inherit and therefore was not identified in her.  Based on [name]'s family history, we recommended those related to her maternal grandmother have genetic counseling and testing. [name]  will let us know if we can be of any assistance in coordinating genetic counseling and/or testing for these family members.  FOLLOW-UP: Lastly, we discussed with [name] that cancer genetics is a rapidly advancing field and it is possible that new genetic tests will be appropriate for her and/or her family members in the future. We encouraged her to remain in contact with cancer genetics on an annual basis so we can update her personal and family histories and let her know of advances in cancer genetics that may benefit this family.    Our contact number was provided. [name] questions were answered to her satisfaction, and she knows she is welcome to call us at anytime with additional questions or concerns.   Faith Rogue, MS, [facility] Genetic Counselor [email or web address] Phone: [phone]

## 2020-01-30 ENCOUNTER — Ambulatory Visit: Payer: No Typology Code available for payment source

## 2020-02-01 ENCOUNTER — Other Ambulatory Visit: Payer: Self-pay | Admitting: *Deleted

## 2020-02-01 DIAGNOSIS — C50919 Malignant neoplasm of unspecified site of unspecified female breast: Secondary | ICD-10-CM

## 2020-02-04 ENCOUNTER — Ambulatory Visit: Admission: RE | Admit: 2020-02-04 | Payer: No Typology Code available for payment source | Source: Ambulatory Visit

## 2020-02-04 DIAGNOSIS — Z510 Encounter for antineoplastic radiation therapy: Secondary | ICD-10-CM | POA: Diagnosis present

## 2020-02-04 DIAGNOSIS — Z170 Estrogen receptor positive status [ER+]: Secondary | ICD-10-CM | POA: Insufficient documentation

## 2020-02-04 DIAGNOSIS — C50411 Malignant neoplasm of upper-outer quadrant of right female breast: Secondary | ICD-10-CM | POA: Insufficient documentation

## 2020-02-05 ENCOUNTER — Ambulatory Visit
Admission: RE | Admit: 2020-02-05 | Discharge: 2020-02-05 | Disposition: A | Discharge status: Home / Self Care | Payer: No Typology Code available for payment source | Source: Ambulatory Visit | Attending: Radiation Oncology | Admitting: Radiation Oncology

## 2020-02-05 DIAGNOSIS — Z510 Encounter for antineoplastic radiation therapy: Secondary | ICD-10-CM | POA: Diagnosis not present

## 2020-02-06 ENCOUNTER — Ambulatory Visit
Admission: RE | Admit: 2020-02-06 | Discharge: 2020-02-06 | Disposition: A | Discharge status: Home / Self Care | Payer: No Typology Code available for payment source | Source: Ambulatory Visit | Attending: Radiation Oncology | Admitting: Radiation Oncology

## 2020-02-06 DIAGNOSIS — Z510 Encounter for antineoplastic radiation therapy: Secondary | ICD-10-CM | POA: Diagnosis not present

## 2020-02-07 ENCOUNTER — Ambulatory Visit
Admission: RE | Admit: 2020-02-07 | Discharge: 2020-02-07 | Disposition: A | Discharge status: Home / Self Care | Payer: No Typology Code available for payment source | Source: Ambulatory Visit | Attending: Radiation Oncology | Admitting: Radiation Oncology

## 2020-02-07 ENCOUNTER — Telehealth: Payer: Self-pay

## 2020-02-07 DIAGNOSIS — Z510 Encounter for antineoplastic radiation therapy: Secondary | ICD-10-CM | POA: Diagnosis not present

## 2020-02-07 DIAGNOSIS — C50411 Malignant neoplasm of upper-outer quadrant of right female breast: Secondary | ICD-10-CM

## 2020-02-07 NOTE — Telephone Encounter (Signed)
Call placed to patient in order to establish contact for the [place] Upbeat protocol referral. Research nurse left a message on secure voice mail for patient to return call at her convenience.  Jeral Fruit, RN, BSN, OCN 02/07/2020 1450 pm

## 2020-02-08 ENCOUNTER — Encounter: Payer: Self-pay | Admitting: *Deleted

## 2020-02-08 ENCOUNTER — Ambulatory Visit
Admission: RE | Admit: 2020-02-08 | Discharge: 2020-02-08 | Disposition: A | Discharge status: Home / Self Care | Payer: No Typology Code available for payment source | Source: Ambulatory Visit | Attending: Radiation Oncology | Admitting: Radiation Oncology

## 2020-02-08 DIAGNOSIS — Z510 Encounter for antineoplastic radiation therapy: Secondary | ICD-10-CM | POA: Diagnosis not present

## 2020-02-08 DIAGNOSIS — Z170 Estrogen receptor positive status [ER+]: Secondary | ICD-10-CM

## 2020-02-08 NOTE — Research (Signed)
Met with patient Sheri [name] following her radiation treatment this morning to determine her interest in the [place] UPBEAT study. She was receptive to discussion and study purpose and assessments were reviewed with her including the Cardiac MRI at [facility], physical performance measures, Neurocognitive battery and Questionnaires. Also informed of the schedule for these assessments at baseline, 3, 12 & 24 months, and that she will receive a $25 gift card after completing all study assessments at each time point. Informed that there is no cost involved other than her travel to [place] for the Cardiac MRI, and that we would work with her to perform study assessments when it is convenient for her - and preferably at a scheduled appointment. She is aware that participation is voluntary, and was informed that this would not change her treatment in any way. Copy of informed consent given to patient to take home and review. Plans made to contact her next week to determine whether she wants to participate, but she has indicated strong interest in participating. Yolande Jolly, BSN, MHA, OCN 02/08/2020 12:13 PM

## 2020-02-11 ENCOUNTER — Ambulatory Visit
Admission: RE | Admit: 2020-02-11 | Discharge: 2020-02-11 | Disposition: A | Discharge status: Home / Self Care | Payer: No Typology Code available for payment source | Source: Ambulatory Visit | Attending: Radiation Oncology | Admitting: Radiation Oncology

## 2020-02-11 DIAGNOSIS — Z510 Encounter for antineoplastic radiation therapy: Secondary | ICD-10-CM | POA: Diagnosis not present

## 2020-02-12 ENCOUNTER — Ambulatory Visit
Admission: RE | Admit: 2020-02-12 | Discharge: 2020-02-12 | Disposition: A | Discharge status: Home / Self Care | Payer: No Typology Code available for payment source | Source: Ambulatory Visit | Attending: Radiation Oncology | Admitting: Radiation Oncology

## 2020-02-12 DIAGNOSIS — Z510 Encounter for antineoplastic radiation therapy: Secondary | ICD-10-CM | POA: Diagnosis not present

## 2020-02-13 ENCOUNTER — Ambulatory Visit
Admission: RE | Admit: 2020-02-13 | Discharge: 2020-02-13 | Disposition: A | Discharge status: Home / Self Care | Payer: No Typology Code available for payment source | Source: Ambulatory Visit | Attending: Radiation Oncology | Admitting: Radiation Oncology

## 2020-02-13 DIAGNOSIS — Z510 Encounter for antineoplastic radiation therapy: Secondary | ICD-10-CM | POA: Diagnosis not present

## 2020-02-14 ENCOUNTER — Ambulatory Visit
Admission: RE | Admit: 2020-02-14 | Discharge: 2020-02-14 | Disposition: A | Discharge status: Home / Self Care | Payer: No Typology Code available for payment source | Source: Ambulatory Visit | Attending: Radiation Oncology | Admitting: Radiation Oncology

## 2020-02-14 DIAGNOSIS — Z510 Encounter for antineoplastic radiation therapy: Secondary | ICD-10-CM | POA: Diagnosis not present

## 2020-02-15 ENCOUNTER — Ambulatory Visit
Admission: RE | Admit: 2020-02-15 | Discharge: 2020-02-15 | Disposition: A | Discharge status: Home / Self Care | Payer: No Typology Code available for payment source | Source: Ambulatory Visit | Attending: Radiation Oncology | Admitting: Radiation Oncology

## 2020-02-15 DIAGNOSIS — Z510 Encounter for antineoplastic radiation therapy: Secondary | ICD-10-CM | POA: Diagnosis not present

## 2020-02-18 ENCOUNTER — Ambulatory Visit: Payer: No Typology Code available for payment source

## 2020-02-18 ENCOUNTER — Ambulatory Visit
Admission: RE | Admit: 2020-02-18 | Discharge: 2020-02-18 | Disposition: A | Discharge status: Home / Self Care | Payer: No Typology Code available for payment source | Source: Ambulatory Visit | Attending: Radiation Oncology | Admitting: Radiation Oncology

## 2020-02-18 DIAGNOSIS — Z510 Encounter for antineoplastic radiation therapy: Secondary | ICD-10-CM | POA: Diagnosis not present

## 2020-02-19 ENCOUNTER — Ambulatory Visit
Admission: RE | Admit: 2020-02-19 | Discharge: 2020-02-19 | Disposition: A | Discharge status: Home / Self Care | Payer: No Typology Code available for payment source | Source: Ambulatory Visit | Attending: Radiation Oncology | Admitting: Radiation Oncology

## 2020-02-19 DIAGNOSIS — Z510 Encounter for antineoplastic radiation therapy: Secondary | ICD-10-CM | POA: Diagnosis not present

## 2020-02-20 ENCOUNTER — Ambulatory Visit
Admission: RE | Admit: 2020-02-20 | Discharge: 2020-02-20 | Disposition: A | Discharge status: Home / Self Care | Payer: No Typology Code available for payment source | Source: Ambulatory Visit | Attending: Radiation Oncology | Admitting: Radiation Oncology

## 2020-02-20 DIAGNOSIS — Z510 Encounter for antineoplastic radiation therapy: Secondary | ICD-10-CM | POA: Diagnosis not present

## 2020-02-21 ENCOUNTER — Inpatient Hospital Stay: Payer: No Typology Code available for payment source | Attending: Radiation Oncology

## 2020-02-21 ENCOUNTER — Ambulatory Visit
Admission: RE | Admit: 2020-02-21 | Discharge: 2020-02-21 | Disposition: A | Discharge status: Home / Self Care | Payer: No Typology Code available for payment source | Source: Ambulatory Visit | Attending: Radiation Oncology | Admitting: Radiation Oncology

## 2020-02-21 ENCOUNTER — Other Ambulatory Visit: Payer: Self-pay

## 2020-02-21 DIAGNOSIS — C50411 Malignant neoplasm of upper-outer quadrant of right female breast: Secondary | ICD-10-CM | POA: Insufficient documentation

## 2020-02-21 DIAGNOSIS — Z510 Encounter for antineoplastic radiation therapy: Secondary | ICD-10-CM | POA: Insufficient documentation

## 2020-02-21 DIAGNOSIS — C50919 Malignant neoplasm of unspecified site of unspecified female breast: Secondary | ICD-10-CM

## 2020-02-21 DIAGNOSIS — Z170 Estrogen receptor positive status [ER+]: Secondary | ICD-10-CM

## 2020-02-21 LAB — CBC
HCT: 35.1 % — ABNORMAL LOW (ref 36.0–46.0)
Hemoglobin: 11.9 g/dL — ABNORMAL LOW (ref 12.0–15.0)
MCH: 30.2 pg (ref 26.0–34.0)
MCHC: 33.9 g/dL (ref 30.0–36.0)
MCV: 89.1 fL (ref 80.0–100.0)
Platelets: 244 10*3/uL (ref 150–400)
RBC: 3.94 MIL/uL (ref 3.87–5.11)
RDW: 12.4 % (ref 11.5–15.5)
WBC: 7.3 10*3/uL (ref 4.0–10.5)
nRBC: 0 % (ref 0.0–0.2)

## 2020-02-22 ENCOUNTER — Ambulatory Visit
Admission: RE | Admit: 2020-02-22 | Discharge: 2020-02-22 | Disposition: A | Discharge status: Home / Self Care | Payer: No Typology Code available for payment source | Source: Ambulatory Visit | Attending: Radiation Oncology | Admitting: Radiation Oncology

## 2020-02-22 DIAGNOSIS — Z510 Encounter for antineoplastic radiation therapy: Secondary | ICD-10-CM | POA: Diagnosis not present

## 2020-02-22 NOTE — Research (Signed)
Patient in to the cancer center today unaccompanied for her lab and radiation appointments today as well as a research consent visit for [place] "Upbeat" study. Research nurses, Yolande Jolly, RN and myself  met with the patient following her lab appointment in the small conference room for privacy. The [phone] protocol consent version date 08/29/2019, with an active [place] date of 10/04/2019 was presented to the patient in it's entirety with specific explanation of the protocol treatment, alternatives, potential risks, side effects, and potential benefits. The patients questions were answered to her verbalized satisfaction. She understands that her participation is strictly voluntary, she can withdraw from the study at any time and she will not be paid for her participation. The patient signed the ICF / Hipaa and registration will be initiated. The patient was given a copy of her signed informed consent documents. The patient was also given a copy of the consent / hipaa for the DCP-001 NCI protocol for her review at home. The patient is aware that she is to start on the protocol after she completes her radiation on 03/04/2020 and begins taking her AI, with baseline information required obtained prior to her starting her AI. Research nurse will call the patient closer to her start time to schedule her baseline visit and cardiac MRI. Research nurses thanked the patient for her willingness to participate in the clinical trials. Approximately one hour was spent in review of the protocol and protocol requirements with the patient.   Jeral Fruit, RN, BSN, OCN Late entry on 02/22/2020 for 02/21/2020 0944 am.

## 2020-02-25 ENCOUNTER — Ambulatory Visit
Admission: RE | Admit: 2020-02-25 | Discharge: 2020-02-25 | Disposition: A | Discharge status: Home / Self Care | Payer: No Typology Code available for payment source | Source: Ambulatory Visit | Attending: Radiation Oncology | Admitting: Radiation Oncology

## 2020-02-25 DIAGNOSIS — Z170 Estrogen receptor positive status [ER+]: Secondary | ICD-10-CM | POA: Diagnosis not present

## 2020-02-25 DIAGNOSIS — C50411 Malignant neoplasm of upper-outer quadrant of right female breast: Secondary | ICD-10-CM | POA: Diagnosis not present

## 2020-02-26 ENCOUNTER — Ambulatory Visit
Admission: RE | Admit: 2020-02-26 | Discharge: 2020-02-26 | Disposition: A | Discharge status: Home / Self Care | Payer: No Typology Code available for payment source | Source: Ambulatory Visit | Attending: Radiation Oncology | Admitting: Radiation Oncology

## 2020-02-26 DIAGNOSIS — C50411 Malignant neoplasm of upper-outer quadrant of right female breast: Secondary | ICD-10-CM | POA: Diagnosis not present

## 2020-02-27 ENCOUNTER — Ambulatory Visit
Admission: RE | Admit: 2020-02-27 | Discharge: 2020-02-27 | Disposition: A | Discharge status: Home / Self Care | Payer: No Typology Code available for payment source | Source: Ambulatory Visit | Attending: Oncology | Admitting: Oncology

## 2020-02-27 ENCOUNTER — Other Ambulatory Visit: Payer: Self-pay

## 2020-02-27 ENCOUNTER — Ambulatory Visit
Admission: RE | Admit: 2020-02-27 | Discharge: 2020-02-27 | Disposition: A | Discharge status: Home / Self Care | Payer: No Typology Code available for payment source | Source: Ambulatory Visit | Attending: Radiation Oncology | Admitting: Radiation Oncology

## 2020-02-27 DIAGNOSIS — C50411 Malignant neoplasm of upper-outer quadrant of right female breast: Secondary | ICD-10-CM | POA: Insufficient documentation

## 2020-02-27 DIAGNOSIS — Z170 Estrogen receptor positive status [ER+]: Secondary | ICD-10-CM | POA: Insufficient documentation

## 2020-02-27 DIAGNOSIS — Z7189 Other specified counseling: Secondary | ICD-10-CM | POA: Insufficient documentation

## 2020-02-28 ENCOUNTER — Ambulatory Visit
Admission: RE | Admit: 2020-02-28 | Discharge: 2020-02-28 | Disposition: A | Discharge status: Home / Self Care | Payer: No Typology Code available for payment source | Source: Ambulatory Visit | Attending: Radiation Oncology | Admitting: Radiation Oncology

## 2020-02-28 DIAGNOSIS — C50411 Malignant neoplasm of upper-outer quadrant of right female breast: Secondary | ICD-10-CM | POA: Diagnosis not present

## 2020-02-29 ENCOUNTER — Ambulatory Visit: Payer: No Typology Code available for payment source

## 2020-03-03 ENCOUNTER — Ambulatory Visit
Admission: RE | Admit: 2020-03-03 | Discharge: 2020-03-03 | Disposition: A | Discharge status: Home / Self Care | Payer: No Typology Code available for payment source | Source: Ambulatory Visit | Attending: Radiation Oncology | Admitting: Radiation Oncology

## 2020-03-03 DIAGNOSIS — C50411 Malignant neoplasm of upper-outer quadrant of right female breast: Secondary | ICD-10-CM | POA: Diagnosis not present

## 2020-03-04 ENCOUNTER — Other Ambulatory Visit (HOSPITAL_COMMUNITY): Payer: Self-pay | Admitting: Oncology

## 2020-03-04 ENCOUNTER — Other Ambulatory Visit: Payer: Self-pay | Admitting: *Deleted

## 2020-03-04 ENCOUNTER — Ambulatory Visit: Payer: No Typology Code available for payment source

## 2020-03-04 ENCOUNTER — Ambulatory Visit
Admission: RE | Admit: 2020-03-04 | Discharge: 2020-03-04 | Disposition: A | Discharge status: Home / Self Care | Payer: No Typology Code available for payment source | Source: Ambulatory Visit | Attending: Radiation Oncology | Admitting: Radiation Oncology

## 2020-03-04 DIAGNOSIS — C50411 Malignant neoplasm of upper-outer quadrant of right female breast: Secondary | ICD-10-CM | POA: Diagnosis not present

## 2020-03-04 DIAGNOSIS — Z006 Encounter for examination for normal comparison and control in clinical research program: Secondary | ICD-10-CM

## 2020-03-05 ENCOUNTER — Ambulatory Visit
Admission: RE | Admit: 2020-03-05 | Discharge: 2020-03-05 | Disposition: A | Discharge status: Home / Self Care | Payer: No Typology Code available for payment source | Source: Ambulatory Visit | Attending: Radiation Oncology | Admitting: Radiation Oncology

## 2020-03-05 DIAGNOSIS — C50411 Malignant neoplasm of upper-outer quadrant of right female breast: Secondary | ICD-10-CM | POA: Diagnosis not present

## 2020-03-06 DIAGNOSIS — C50411 Malignant neoplasm of upper-outer quadrant of right female breast: Secondary | ICD-10-CM

## 2020-03-06 NOTE — Research (Signed)
Research nurse spoke with Dr. Janese Banks this morning to inform her of patients decision to participate in the [place] protocol. Patient is unable to complete her baseline Cardiac MRI and assessments with labs until March 12, 2020 due to her schedule, and radiation was just completed on August 11,2021. The patient will need to hold starting her AI until after the assessments are completed. Dr. Janese Banks states that this is absolutely alright for the patient to wait and start her AI March 12, 2020 after protocol requirements are completed.   Jeral Fruit, RN, BSN, OCN 03/06/2020 [phone] am

## 2020-03-07 ENCOUNTER — Encounter: Payer: Self-pay | Admitting: Oncology

## 2020-03-07 NOTE — Telephone Encounter (Signed)
I think she should contact her pcp. We can check iron levels at next visit to see if it is contributing

## 2020-03-11 ENCOUNTER — Other Ambulatory Visit: Payer: Self-pay

## 2020-03-11 DIAGNOSIS — Z170 Estrogen receptor positive status [ER+]: Secondary | ICD-10-CM

## 2020-03-11 DIAGNOSIS — C50411 Malignant neoplasm of upper-outer quadrant of right female breast: Secondary | ICD-10-CM

## 2020-03-12 ENCOUNTER — Inpatient Hospital Stay: Payer: No Typology Code available for payment source | Attending: Oncology

## 2020-03-12 ENCOUNTER — Other Ambulatory Visit: Payer: Self-pay

## 2020-03-12 ENCOUNTER — Ambulatory Visit (HOSPITAL_COMMUNITY)
Admission: RE | Admit: 2020-03-12 | Discharge: 2020-03-12 | Disposition: A | Discharge status: Home / Self Care | Payer: No Typology Code available for payment source | Source: Ambulatory Visit | Attending: Oncology | Admitting: Oncology

## 2020-03-12 DIAGNOSIS — Z170 Estrogen receptor positive status [ER+]: Secondary | ICD-10-CM

## 2020-03-12 DIAGNOSIS — C50411 Malignant neoplasm of upper-outer quadrant of right female breast: Secondary | ICD-10-CM

## 2020-03-12 DIAGNOSIS — Z006 Encounter for examination for normal comparison and control in clinical research program: Secondary | ICD-10-CM | POA: Insufficient documentation

## 2020-03-12 DIAGNOSIS — C50919 Malignant neoplasm of unspecified site of unspecified female breast: Secondary | ICD-10-CM | POA: Insufficient documentation

## 2020-03-12 NOTE — Research (Signed)
WF [id number] "UPBEAT" Baseline Protocol Visit:   Patient in to the clinic today unaccompanied for her scheduled baseline visit for the Upbeat [address] protocol. Central labs were obtained as well as optional labs per consent by Cierra at [place] am, given to lab technicians Maudie Mercury and [place] for processing with instructions. The patient confirmed she has been fasting since 7:30 pm last night for her labs. Research activities were completed per protocol requirements which included a medication review, vital signs, height and weight with BMI measurement, waist circumference measurement, baseline questionnaires, neurocognitive testing, 6-minute walk, disability measures, and short physical performance battery  (SPPB)  without any difficulty. The expanded short physical performance battery (SPPB) included balance tests, gait speed test, narrow walk test and the chair stand test. Her vital signs were stable with P-70 and BP of 136/79 taken on right arm, weight was 135.1 lbs and height was 62 in. The patient was allowed to rest for 1 minute and re-taken BP- 130/68 P-68. The patient stated she did not have any physical discomfort or difficulty in completing the required physical assessments. The patient was provided with her gift card from [facility] for $25 for which she signed receipt, and her Upbeat tote bag from the study. Approximately 1.5 hours in total were spent with the patient in completion of all study requirements. The patient was thanked for her participation in the clinical trial and is aware of the next protocol visits with activities to be completed. Jeral Fruit, RN, BSN, OCN 03/12/2020 1147 am

## 2020-03-12 NOTE — Research (Signed)
DCP-001 Protocol Consent Visit:   The patient presented to the cancer center this morning for a scheduled visit unaccompanied. The DCP-001 protocol consent and Hipaa version dated 01/16/2019 was reviewed with the patient in it's entirety. Specific explanation was provided of the protocol treatment, alternatives, potential risks, side effects, and potential benefits. The patient's questions were answered to her verbalized satisfaction The patient understands that her participation if voluntary and she can withdraw her consent at any time without any change in her current treatment. The patient signed the ICF and registration will be completed at this time. The patient was given a copy of the signed informed consent for her reference. Approximately 30 minutes was spent with the patient in review of the protocol this morning.  Jeral Fruit, RN, BSN, OCN 03/12/2020 1205 pm

## 2020-03-25 ENCOUNTER — Other Ambulatory Visit: Payer: Self-pay

## 2020-03-25 DIAGNOSIS — Z170 Estrogen receptor positive status [ER+]: Secondary | ICD-10-CM

## 2020-04-10 ENCOUNTER — Inpatient Hospital Stay: Payer: No Typology Code available for payment source | Attending: Oncology

## 2020-04-10 ENCOUNTER — Other Ambulatory Visit: Payer: Self-pay

## 2020-04-10 DIAGNOSIS — Z170 Estrogen receptor positive status [ER+]: Secondary | ICD-10-CM

## 2020-04-10 NOTE — Research (Signed)
WF [id number] "Upbeat" 1 month lab visit:   Patient in to the cancer center for her scheduled lab visit for the study today unaccompanied. Research nurse met with the patient in the lobby and escorted her to the lab with th appropriate lab tubes for her protocol central labs today. Labs were drawn peripherally, not fasting per protocol requirements by Ciera in her left arm. The patient denied any complaints of pain at the site. Two lab tubes were drawn for serum and plasma analysis and given to [facility] with explanation of processing requirements and shipping requirements for next week. The blood will be shipped on Monday by Yolande Jolly, RN as the biospecimen lab for the study does not take specimens on Friday or the weekend. The tubes will be shipped on dry ice at that time. The kit # used was number [id number] with an expiration date of 07/25/2020. Jeral Fruit, RN, BSN, OCN Date: 04/10/2020 Time: 1400 pm

## 2020-04-18 ENCOUNTER — Ambulatory Visit: Payer: No Typology Code available for payment source | Admitting: Radiation Oncology

## 2020-05-05 DIAGNOSIS — C50911 Malignant neoplasm of unspecified site of right female breast: Secondary | ICD-10-CM | POA: Insufficient documentation

## 2020-05-19 ENCOUNTER — Other Ambulatory Visit: Payer: Self-pay

## 2020-05-19 DIAGNOSIS — C50411 Malignant neoplasm of upper-outer quadrant of right female breast: Secondary | ICD-10-CM

## 2020-05-19 DIAGNOSIS — Z170 Estrogen receptor positive status [ER+]: Secondary | ICD-10-CM

## 2020-05-20 ENCOUNTER — Inpatient Hospital Stay: Payer: No Typology Code available for payment source | Admitting: Oncology

## 2020-05-20 ENCOUNTER — Ambulatory Visit: Payer: No Typology Code available for payment source | Admitting: Oncology

## 2020-05-20 ENCOUNTER — Other Ambulatory Visit: Payer: No Typology Code available for payment source

## 2020-05-20 ENCOUNTER — Inpatient Hospital Stay: Payer: No Typology Code available for payment source | Attending: Oncology

## 2020-05-20 ENCOUNTER — Other Ambulatory Visit: Payer: Self-pay

## 2020-05-20 DIAGNOSIS — C50411 Malignant neoplasm of upper-outer quadrant of right female breast: Secondary | ICD-10-CM

## 2020-05-20 DIAGNOSIS — Z79811 Long term (current) use of aromatase inhibitors: Secondary | ICD-10-CM | POA: Insufficient documentation

## 2020-05-20 DIAGNOSIS — Z170 Estrogen receptor positive status [ER+]: Secondary | ICD-10-CM

## 2020-05-20 DIAGNOSIS — M85852 Other specified disorders of bone density and structure, left thigh: Secondary | ICD-10-CM | POA: Insufficient documentation

## 2020-05-20 DIAGNOSIS — Z923 Personal history of irradiation: Secondary | ICD-10-CM | POA: Insufficient documentation

## 2020-05-20 DIAGNOSIS — Z006 Encounter for examination for normal comparison and control in clinical research program: Secondary | ICD-10-CM | POA: Insufficient documentation

## 2020-05-20 NOTE — Research (Signed)
[id number] "UPBEAT" 3 Month Visit:  Patient in to the cancer center unaccompanied this morning for her scheduled 3 month Upbeat protocol visit with Dr. Janese Banks. The patient reports being fasting for the past 3 hours. Research labs were drawn peripherally by Lattie Haw at 1002 am for DNA, serum, plasma, lipids, glucose, creatinine, and hematocrit per protocol requirements. The DNA, serum and plasma vials were given to [facility] in microbiology with directions for preparation and storage until shipment, the sample kit ID # is [id number]. The lavendar and red SST vials were sent to the main lab via the tube system for [facility] to pick up and process with the appropriate specimen billing sheet for the protocol. The vials were de-identified and labeled per instructions with the patient intials, protocol ID # , current date of lab specimen draw, date of birth as 22-Sep-1960 and 3 month visit. The patient completed her Upbeat 3 month self-administered questionnaires while waiting for her appointment with Dr. Janese Banks. She was unable to stay for this visit any longer as she had another appointment that she could not be late for. Judeen Hammans, RN for Dr. Janese Banks will call and re-scheduled the patient for later this week.  Jeral Fruit, RN, BSN, OCN Date: 05/20/2020 Time: 1115 am

## 2020-05-21 ENCOUNTER — Telehealth: Payer: Self-pay

## 2020-05-21 ENCOUNTER — Encounter: Payer: Self-pay | Admitting: Hematology and Oncology

## 2020-05-21 ENCOUNTER — Other Ambulatory Visit (HOSPITAL_COMMUNITY): Payer: Self-pay | Admitting: Oncology

## 2020-05-21 DIAGNOSIS — Z006 Encounter for examination for normal comparison and control in clinical research program: Secondary | ICD-10-CM

## 2020-05-21 NOTE — Telephone Encounter (Signed)
Call placed to the patient by this nurse today in an attempt to re-schedule her appointment for her [place] "Upbeat" 3 month protocol visit and activities. Message left at this time for the patient to return call to the research office with her preference for date / time this week if possible.  Jeral Fruit, RN, BSN, OCN Date: 05/21/2020  Time: 1012 am

## 2020-05-22 ENCOUNTER — Telehealth: Payer: Self-pay

## 2020-05-22 DIAGNOSIS — C50411 Malignant neoplasm of upper-outer quadrant of right female breast: Secondary | ICD-10-CM

## 2020-05-22 DIAGNOSIS — Z170 Estrogen receptor positive status [ER+]: Secondary | ICD-10-CM

## 2020-05-22 NOTE — Telephone Encounter (Signed)
Research nurse was able to reach the patient via telephone today and re-schedule her 3 month Upbeat protocol visit for completion. She has requested 2 pm 06/23/2020 with Dr. Janese Banks if it is still available.Research required activities will be completed following that appointment. Jeral Fruit, RN, BSN, OCN Date: 05/22/2020 Time: 1520 pm

## 2020-05-23 ENCOUNTER — Encounter: Payer: Self-pay | Admitting: Oncology

## 2020-05-23 ENCOUNTER — Other Ambulatory Visit: Payer: Self-pay

## 2020-05-23 ENCOUNTER — Inpatient Hospital Stay (HOSPITAL_BASED_OUTPATIENT_CLINIC_OR_DEPARTMENT_OTHER): Payer: No Typology Code available for payment source | Admitting: Oncology

## 2020-05-23 VITALS — BP 124/78 | HR 66 | Temp 96.8°F | Resp 16 | Ht 62.5 in | Wt 133.5 lb

## 2020-05-23 DIAGNOSIS — Z170 Estrogen receptor positive status [ER+]: Secondary | ICD-10-CM

## 2020-05-23 DIAGNOSIS — Z5181 Encounter for therapeutic drug level monitoring: Secondary | ICD-10-CM

## 2020-05-23 DIAGNOSIS — Z79811 Long term (current) use of aromatase inhibitors: Secondary | ICD-10-CM | POA: Diagnosis not present

## 2020-05-23 DIAGNOSIS — M85852 Other specified disorders of bone density and structure, left thigh: Secondary | ICD-10-CM | POA: Diagnosis not present

## 2020-05-23 DIAGNOSIS — Z006 Encounter for examination for normal comparison and control in clinical research program: Secondary | ICD-10-CM

## 2020-05-23 DIAGNOSIS — Z923 Personal history of irradiation: Secondary | ICD-10-CM | POA: Diagnosis not present

## 2020-05-23 DIAGNOSIS — C50411 Malignant neoplasm of upper-outer quadrant of right female breast: Secondary | ICD-10-CM

## 2020-05-23 NOTE — Research (Signed)
WF [id number] Upbeat 3 Month Visit:   Patient in to the cancer center today for her 3 month visit for the "Upbeat" protocol with Dr. Janese Banks. The patient was weighed with a calibrated scale set at zero without her shoes or any heavy items. Weight # 1 was 133.5, re-weight was 133.5 lbs. Height was obtained without her shoes on per protocol at 62.5 in or 5'2.5". Patient was taken to the exam room and allowed to sit for approximately five minutes prior to her blood pressure being obtained. Blood pressure was obtained in her left arm, sitting with her legs straight down on the floor. Her first blood pressure was 136/84 pulse-69, and her second blood pressure one minute later was 124/78, pulse 66. Her temperature was 96.8 and oxygen saturations were 100 %. Her vital signs were given to Moishe Spice, RN for documentation into EMR. Waist circumference was measured at 34 inches per the protocol specifications. Her CV medication review form and other medication review forms were completed, as well as the cardiac evaluation form. The patient denies any cardiac events or hospitalizations since she was here last. Her medications were reviewed with Judeen Hammans, RN and this nurse present and the patient denied any changes to her medications. Judeen Hammans, RN did review the correct dosing for her Vitamin D and Calcium, the patient states she will start taking two instead of one to get the appropriate amount of the vitamins.  Dr. Janese Banks examined the patient today, including breast exam as the patient had voiced a hard area and lumpy areas to her breast. The patient was escorted to the research department to complete the protocol required testing. The six minute walk, disability measures, and expanded SPPB tests were completed without any difficulty for the patient with the assistance of [employer], CRS.  She was able to walk 17 laps in the 6 minute walk, and hold her balance for longer than 10 seconds on all the SPPB tests today without any gait  issues. The 3 month neurocognitive tests were administered without difficulty as well. The patient then completed her Covid-19 questionnaires and her Addendum # 6 consent form was reviewed with the patient agreeing to have her questionnaires emailed to her and complete the 24 month questionnaire that was added. The patient was given her [place] gift card after completion of all activities today # [phone]. Research nurse signed the gift card out per requirement. The schedule for her cardiac MRI was reviewed with the patient and reminded it would be next Thursday, May 29, 2020 at 1000 am. Patient was encouraged to call the MRI scheduling department if she was unable to be there at that time to re-schedule if necessary. The patient's protocol scheduled was reviewed and she is aware of her 12 month visit, and the same research activities at that time. The patient was thanked for her participation in the clinical trial and encouraged to call the research department if she has any questions or concerns. Approximately one and one half hour was spent with this patient to complete her research activities today. Jeral Fruit, RN, BSN, OCN Date: 05/23/2020 Time: 1639 pm

## 2020-05-23 NOTE — Progress Notes (Signed)
Pt felt a firm spot on breast but was told that sometimes you can feel that. I toldher the md will do breast check today

## 2020-05-26 NOTE — Progress Notes (Signed)
Hematology/Oncology Consult note [facility]  Telephone:([phone] [phone]  Patient Care Team: Rusty Aus, MD as PCP - General (Internal Medicine) Rico Junker, RN as Registered Nurse Jeral Fruit, RN as Registered Nurse   Name of the patient: Sheri [name]  [id number]  Jun 04, 1961   Date of visit: 05/26/20  Diagnosis- pathological prognostic stage Ia right breast cancer mpT1b pN0 cM0 ER/PR positive HER-2/neu negative s/p lumpectomy  Chief complaint/ Reason for visit-routine follow-up of breast cancer on Arimidex  Heme/Onc history:  Patient is a 59 year old female with a past medical history significant for hypertension who recently underwent a screening mammogram on 10/18/2019 which showed a possible mass in the right breast. This was followed by a diagnostic mammogram and ultrasound which showed hypoechoic mass measuring 0.8 x 0.8 x 0.9 cm at 10 o'clock position in the right breast 7 cm from the nipple. Right axilla demonstrates normal-appearing lymph nodes. This was biopsied and was consistent with invasive mammary carcinoma grade 1,8 mm ER 91 -100% positive, PR 11 to 20% positive and HER-2/neu negative.  Patient is doing well for her age and denies any complaints at this time. Menarche at the age of 34. She used hormone replacement therapy for a few years in the past. G2, P2 L2. No prior breast biopsies. Age at first pregnancy 28 years. Menopause in her 16s.  Final pathology showed 2 separate foci of tumor 5 mm and 8 mm in size.  No malignancy in the intervening tissue.  4 sentinel lymph nodes negative for malignancy.  Margins negative.  Tumor was overall grade 1 ER/PR positive and HER-2 negative patient completed adjuvant radiation treatment and Started Arimidex in September 2021.   Interval history-patient is tolerating Arimidex along with calcium and vitamin D well without any significant side effects.  ECOG PS-  0 Pain scale- 0   Review of systems- Review of Systems  Constitutional: Negative for chills, fever, malaise/fatigue and weight loss.  HENT: Negative for congestion, ear discharge and nosebleeds.   Eyes: Negative for blurred vision.  Respiratory: Negative for cough, hemoptysis, sputum production, shortness of breath and wheezing.   Cardiovascular: Negative for chest pain, palpitations, orthopnea and claudication.  Gastrointestinal: Negative for abdominal pain, blood in stool, constipation, diarrhea, heartburn, melena, nausea and vomiting.  Genitourinary: Negative for dysuria, flank pain, frequency, hematuria and urgency.  Musculoskeletal: Negative for back pain, joint pain and myalgias.  Skin: Negative for rash.  Neurological: Negative for dizziness, tingling, focal weakness, seizures, weakness and headaches.  Endo/Heme/Allergies: Does not bruise/bleed easily.  Psychiatric/Behavioral: Negative for depression and suicidal ideas. The patient does not have insomnia.       Allergies  Allergen Reactions  . Tape Other (See Comments)    Tears skin/redness (PAPER TAPE OK and tegaderm)_     Past Medical History:  Diagnosis Date  . Breast cancer ([place])   . Dysrhythmia    tachycardia  . Family history of breast cancer   . Family history of prostate cancer   . GERD (gastroesophageal reflux disease)   . Hypertension   . Vitamin D deficiency      Past Surgical History:  Procedure Laterality Date  . ABDOMINAL HYSTERECTOMY     complete  . BREAST BIOPSY Left 2013   negative core  . BREAST BIOPSY Right 2021   INVASIVE MAMMARY CARCINOMA Grade 1. Ductal carcinoma in situ: Present, low-grade  . BREAST EXCISIONAL BIOPSY Right 01/01/2020   RF tag placement [id number]  . CESAREAN  SECTION    . INCONTINENCE SURGERY    . NASAL SINUS SURGERY Left 2010  . PARTIAL MASTECTOMY WITH NEEDLE LOCALIZATION AND AXILLARY SENTINEL LYMPH NODE BX Right 01/07/2020   Procedure: PARTIAL MASTECTOMY WITH  RADIOFREQUENCY TAG, RIGHT AXILLARY SENTINEL LYMPH NODE BIOPSY;  Surgeon: Herbert Pun, MD;  Location: [facility] ORS;  Service: General;  Laterality: Right;  . TONSILLECTOMY      Social History   Socioeconomic History  . Marital status: Divorced    Spouse name: Not on file  . Number of children: Not on file  . Years of education: Not on file  . Highest education level: Not on file  Occupational History  . Not on file  Tobacco Use  . Smoking status: Former Research scientist (life sciences)  . Smokeless tobacco: Never Used  . Tobacco comment: 33 years ago  Vaping Use  . Vaping Use: Never used  Substance and Sexual Activity  . Alcohol use: Not Currently  . Drug use: Never  . Sexual activity: Not on file  Other Topics Concern  . Not on file  Social History Narrative  . Not on file   Social Determinants of Health   Financial Resource Strain:   . Difficulty of Paying Living Expenses: Not on file  Food Insecurity:   . Worried About Charity fundraiser in the Last Year: Not on file  . Ran Out of Food in the Last Year: Not on file  Transportation Needs:   . Lack of Transportation (Medical): Not on file  . Lack of Transportation (Non-Medical): Not on file  Physical Activity:   . Days of Exercise per Week: Not on file  . Minutes of Exercise per Session: Not on file  Stress:   . Feeling of Stress : Not on file  Social Connections:   . Frequency of Communication with Friends and Family: Not on file  . Frequency of Social Gatherings with Friends and Family: Not on file  . Attends Religious Services: Not on file  . Active Member of Clubs or Organizations: Not on file  . Attends Archivist Meetings: Not on file  . Marital Status: Not on file  Intimate Partner Violence:   . Fear of Current or Ex-Partner: Not on file  . Emotionally Abused: Not on file  . Physically Abused: Not on file  . Sexually Abused: Not on file    Family History  Problem Relation Age of Onset  . Breast cancer  Maternal Grandmother        dx late 6s  . Prostate cancer Father        dx 32s  . Prostate cancer Brother        dx 40s-50s  . Cancer Paternal Grandmother        tumor on ureter dx 80s     Current Outpatient Medications:  .  acetaminophen (TYLENOL) 500 MG tablet, Take 500-1,000 mg by mouth every 6 (six) hours as needed (for pain.)., Disp: , Rfl:  .  anastrozole (ARIMIDEX) 1 MG tablet, Take 1 tablet (1 mg total) by mouth daily., Disp: 30 tablet, Rfl: 3 .  B Complex Vitamins (VITAMIN B COMPLEX PO), Take 1 tablet by mouth once a week., Disp: , Rfl:  .  calcium carbonate (TUMS - DOSED IN MG ELEMENTAL CALCIUM) 500 MG chewable tablet, Chew 1-2 tablets by mouth daily as needed for indigestion or heartburn., Disp: , Rfl:  .  Calcium-Magnesium-Vitamin D (CALCIUM 1200+D3 PO), Take 1 Dose by mouth daily., Disp: , Rfl:  .  Cholecalciferol 25 MCG (1000 UT) tablet, Take 1,000 Units by mouth 2 (two) times a week. , Disp: , Rfl:  .  hydrochlorothiazide (MICROZIDE) 12.5 MG capsule, Take 12.5 mg by mouth daily as needed (elevated blood pressure/fluid retention.). , Disp: , Rfl:  .  metoprolol succinate (TOPROL-XL) 25 MG 24 hr tablet, Take 12.5 mg by mouth daily as needed (elevated blood pressure.). , Disp: , Rfl:  .  traZODone (DESYREL) 50 MG tablet, Take 25 mg by mouth every other day. At night, Disp: , Rfl:   Physical exam:  Vitals:   05/23/20 1445  BP: 124/78  Pulse: 66  Resp: 16  Temp: (!) 96.8 F (36 C)  TempSrc: Tympanic  Weight: 133 lb 8 oz (60.6 kg)  Height: 5' 2.5" (1.588 m)   Physical Exam HENT:     Head: Normocephalic and atraumatic.  Eyes:     Pupils: Pupils are equal, round, and reactive to light.  Cardiovascular:     Rate and Rhythm: Normal rate and regular rhythm.     Heart sounds: Normal heart sounds.  Pulmonary:     Effort: Pulmonary effort is normal.     Breath sounds: Normal breath sounds.  Abdominal:     General: Bowel sounds are normal.     Palpations: Abdomen is  soft.  Musculoskeletal:     Cervical back: Normal range of motion.  Skin:    General: Skin is warm and dry.  Neurological:     Mental Status: She is alert and oriented to person, place, and time.     Breast exam was performed in seated and lying down position. Patient is status post right lumpectomy with a well-healed surgical scar.  Expected postsurgical changes noted in the right breast.  No evidence of any palpable masses. No evidence of axillary adenopathy. No evidence of any palpable masses or lumps in the left breast. No evidence of leftt axillary adenopathy  No flowsheet data found. CBC Latest Ref Rng & Units 02/21/2020  WBC 4.0 - 10.5 K/uL 7.3  Hemoglobin 12.0 - 15.0 g/dL 11.9(L)  Hematocrit 36 - 46 % 35.1(L)  Platelets 150 - 400 K/uL 244     Assessment and plan- Patient is a 59 y.o. female with stage I ER positive right breast cancer s/p lumpectomy and adjuvant radiation treatment and currently on Arimidex.  This is a routine follow-up visit  Patient is tolerating Arimidex well without any significant side effects and will continue to take it for 5 years.Her bone density scan did show evidence of osteopenia with a T score of -2.4 at the left femur neck.  Her 10-year probability of a major osteoporotic fracture was 11% and hip fracture was 2%.  Since it is below the cutoff at 20 and 3% respectively she does not benefit from adjuvant bisphosphonate at this time.  I will consider getting another bone density scan next year.  She will need a diagnostic bilateral mammogram in March 2022.  She will continue taking Arimidex along with calcium 1200 mg and vitamin D 800 international units and I will see her back in 4 months with CBC CMP and vitamin D levels   Visit Diagnosis 1. Malignant neoplasm of upper-outer quadrant of right breast in female, estrogen receptor positive ([place])   2. Osteopenia of neck of left femur   3. Visit for monitoring Arimidex therapy      Dr. Randa Evens, MD,  [facility] [phone] 05/26/2020 1:50 PM

## 2020-05-29 ENCOUNTER — Other Ambulatory Visit: Payer: Self-pay

## 2020-05-29 ENCOUNTER — Ambulatory Visit (HOSPITAL_COMMUNITY)
Admission: RE | Admit: 2020-05-29 | Discharge: 2020-05-29 | Disposition: A | Discharge status: Home / Self Care | Payer: No Typology Code available for payment source | Source: Ambulatory Visit | Attending: Oncology | Admitting: Oncology

## 2020-05-29 DIAGNOSIS — Z006 Encounter for examination for normal comparison and control in clinical research program: Secondary | ICD-10-CM | POA: Insufficient documentation

## 2020-07-04 ENCOUNTER — Telehealth: Payer: Self-pay | Admitting: *Deleted

## 2020-07-04 NOTE — Telephone Encounter (Signed)
07/04/20 4:31 PM This RN spoke with patient Sheri [name] to inform her the cardiac MRI charge had been redirected to the trial, and apologized for the inconvenience.  Patient expressed appreciation for the update. Doreatha Martin, RN, BSN, [facility] 07/04/2020 4:32 PM

## 2020-07-17 ENCOUNTER — Other Ambulatory Visit: Payer: Self-pay | Admitting: Oncology

## 2020-08-06 ENCOUNTER — Other Ambulatory Visit: Payer: Self-pay | Admitting: Dermatology

## 2020-09-09 ENCOUNTER — Other Ambulatory Visit: Payer: Self-pay | Admitting: Internal Medicine

## 2020-09-25 ENCOUNTER — Other Ambulatory Visit: Payer: No Typology Code available for payment source

## 2020-09-25 ENCOUNTER — Ambulatory Visit: Payer: No Typology Code available for payment source | Admitting: Oncology

## 2020-10-02 ENCOUNTER — Other Ambulatory Visit: Payer: No Typology Code available for payment source

## 2020-10-02 ENCOUNTER — Ambulatory Visit: Payer: No Typology Code available for payment source | Admitting: Oncology

## 2020-10-08 ENCOUNTER — Telehealth: Payer: Self-pay | Admitting: Oncology

## 2020-10-08 NOTE — Telephone Encounter (Signed)
Left message with pt requesting a call back in regards to her appointment on 4/7.    (Per Research RN :Sheri [name] will need to be scheduled for a 12 month follow up for the "Upbeat" protocol with research labs as well 02/20/2021 with a +/- 60 day window. Thanks, Ivin Booty".

## 2020-10-09 ENCOUNTER — Ambulatory Visit: Payer: No Typology Code available for payment source | Admitting: Oncology

## 2020-10-09 ENCOUNTER — Other Ambulatory Visit: Payer: No Typology Code available for payment source

## 2020-10-22 ENCOUNTER — Other Ambulatory Visit: Payer: No Typology Code available for payment source

## 2020-10-24 ENCOUNTER — Ambulatory Visit
Admission: RE | Admit: 2020-10-24 | Discharge: 2020-10-24 | Disposition: A | Discharge status: Home / Self Care | Payer: No Typology Code available for payment source | Source: Ambulatory Visit | Attending: Oncology | Admitting: Oncology

## 2020-10-24 ENCOUNTER — Other Ambulatory Visit: Payer: Self-pay

## 2020-10-24 DIAGNOSIS — C50411 Malignant neoplasm of upper-outer quadrant of right female breast: Secondary | ICD-10-CM

## 2020-10-24 DIAGNOSIS — Z170 Estrogen receptor positive status [ER+]: Secondary | ICD-10-CM

## 2020-10-24 HISTORY — DX: Personal history of irradiation: Z92.3

## 2020-10-30 ENCOUNTER — Encounter: Payer: Self-pay | Admitting: Oncology

## 2020-10-30 ENCOUNTER — Inpatient Hospital Stay (HOSPITAL_BASED_OUTPATIENT_CLINIC_OR_DEPARTMENT_OTHER): Payer: No Typology Code available for payment source | Admitting: Oncology

## 2020-10-30 ENCOUNTER — Inpatient Hospital Stay: Payer: No Typology Code available for payment source | Attending: Oncology

## 2020-10-30 VITALS — BP 147/80 | HR 65 | Temp 97.1°F | Wt 136.9 lb

## 2020-10-30 DIAGNOSIS — I10 Essential (primary) hypertension: Secondary | ICD-10-CM | POA: Diagnosis not present

## 2020-10-30 DIAGNOSIS — E559 Vitamin D deficiency, unspecified: Secondary | ICD-10-CM | POA: Diagnosis not present

## 2020-10-30 DIAGNOSIS — Z79811 Long term (current) use of aromatase inhibitors: Secondary | ICD-10-CM

## 2020-10-30 DIAGNOSIS — C50411 Malignant neoplasm of upper-outer quadrant of right female breast: Secondary | ICD-10-CM

## 2020-10-30 DIAGNOSIS — Z170 Estrogen receptor positive status [ER+]: Secondary | ICD-10-CM | POA: Insufficient documentation

## 2020-10-30 DIAGNOSIS — Z5181 Encounter for therapeutic drug level monitoring: Secondary | ICD-10-CM | POA: Diagnosis not present

## 2020-10-30 DIAGNOSIS — M8580 Other specified disorders of bone density and structure, unspecified site: Secondary | ICD-10-CM | POA: Insufficient documentation

## 2020-10-30 DIAGNOSIS — M85852 Other specified disorders of bone density and structure, left thigh: Secondary | ICD-10-CM | POA: Diagnosis not present

## 2020-10-30 LAB — CBC WITH DIFFERENTIAL/PLATELET
Abs Immature Granulocytes: 0.02 10*3/uL (ref 0.00–0.07)
Basophils Absolute: 0 10*3/uL (ref 0.0–0.1)
Basophils Relative: 1 %
Eosinophils Absolute: 0.2 10*3/uL (ref 0.0–0.5)
Eosinophils Relative: 3 %
HCT: 36.1 % (ref 36.0–46.0)
Hemoglobin: 12.1 g/dL (ref 12.0–15.0)
Immature Granulocytes: 0 %
Lymphocytes Relative: 18 %
Lymphs Abs: 1.1 10*3/uL (ref 0.7–4.0)
MCH: 30.1 pg (ref 26.0–34.0)
MCHC: 33.5 g/dL (ref 30.0–36.0)
MCV: 89.8 fL (ref 80.0–100.0)
Monocytes Absolute: 0.5 10*3/uL (ref 0.1–1.0)
Monocytes Relative: 7 %
Neutro Abs: 4.3 10*3/uL (ref 1.7–7.7)
Neutrophils Relative %: 71 %
Platelets: 238 10*3/uL (ref 150–400)
RBC: 4.02 MIL/uL (ref 3.87–5.11)
RDW: 12.1 % (ref 11.5–15.5)
WBC: 6.1 10*3/uL (ref 4.0–10.5)
nRBC: 0 % (ref 0.0–0.2)

## 2020-10-30 LAB — COMPREHENSIVE METABOLIC PANEL
ALT: 13 U/L (ref 0–44)
AST: 17 U/L (ref 15–41)
Albumin: 3.7 g/dL (ref 3.5–5.0)
Alkaline Phosphatase: 77 U/L (ref 38–126)
Anion gap: 8 (ref 5–15)
BUN: 17 mg/dL (ref 6–20)
CO2: 29 mmol/L (ref 22–32)
Calcium: 9.1 mg/dL (ref 8.9–10.3)
Chloride: 103 mmol/L (ref 98–111)
Creatinine, Ser: 0.8 mg/dL (ref 0.44–1.00)
GFR, Estimated: >60 mL/min (ref >60)
Glucose, Bld: 106 mg/dL — ABNORMAL HIGH (ref 70–99)
Potassium: 4.1 mmol/L (ref 3.5–5.1)
Sodium: 140 mmol/L (ref 135–145)
Total Bilirubin: 0.6 mg/dL (ref 0.3–1.2)
Total Protein: 6.5 g/dL (ref 6.5–8.1)

## 2020-10-30 LAB — VITAMIN D 25 HYDROXY (VIT D DEFICIENCY, FRACTURES): Vit D, 25-Hydroxy: 43.38 ng/mL (ref 30–100)

## 2020-10-30 NOTE — Progress Notes (Signed)
Survivorship Care Plan visit completed.  Treatment summary reviewed and given to patient.  ASCO answers booklet reviewed and given to patient.  CARE program and Cancer Transitions discussed with patient along with other resources cancer center offers to patients and caregivers.  Patient verbalized understanding.    

## 2020-10-30 NOTE — Progress Notes (Signed)
Hematology/Oncology Consult note [facility]  [phone] [phone]  Patient Care Team: Rusty Aus, MD as PCP - General (Internal Medicine) Sindy Guadeloupe, MD as Consulting Physician (Oncology) Herbert Pun, MD as Consulting Physician (General Surgery) Noreene Filbert, MD as Referring Physician (Radiation Oncology) Rico Junker, RN as Registered Nurse Jeral Fruit, RN as Registered Nurse   Name of the patient: Sheri [name]  [id number]  1960/12/04   Date of visit: 10/30/20  Diagnosis- pathological prognostic stage Ia right breast cancermpT1b pN0cM0 ER/PR positive HER-2/neu negative s/p lumpectomy  Chief complaint/ Reason for visit-routine follow-up of breast cancer on Arimidex  Heme/Onc history: Patient is a 60 year old female with a past medical history significant for hypertension who recently underwent a screening mammogram on 10/18/2019 which showed a possible mass in the right breast. This was followed by a diagnostic mammogram and ultrasound which showed hypoechoic mass measuring 0.8 x 0.8 x 0.9 cm at 10 o'clock position in the right breast 7 cm from the nipple. Right axilla demonstrates normal-appearing lymph nodes. This was biopsied and was consistent with invasive mammary carcinoma grade 1,8 mm ER 91 -100% positive, PR 11 to 20% positive and HER-2/neu negative.  Patient is doing well for her age and denies any complaints at this time. Menarche at the age of 66. She used hormone replacement therapy for a few years in the past. G2, P2 L2. No prior breast biopsies. Age at first pregnancy 28 years. Menopause in her 22s.  Final pathology showed 2 separate foci of tumor 5 mm and 8 mm in size. No malignancy in the intervening tissue. 4 sentinel lymph nodes negative for malignancy. Margins negative. Tumor was overall grade 1ER/PR positive and HER-2 negative patient completed adjuvant radiation  treatment and Started Arimidex in September 2021.  Interval history-patient reports doing well.  Appetite and weight have remained stable.  She is compliant with Arimidex calcium and vitamin D.  She reports self-limited joint pains but it does not affect her quality of life significantly.  ECOG PS- 0 Pain scale- 0   Review of systems- Review of Systems  Constitutional: Negative for chills, fever, malaise/fatigue and weight loss.  HENT: Negative for congestion, ear discharge and nosebleeds.   Eyes: Negative for blurred vision.  Respiratory: Negative for cough, hemoptysis, sputum production, shortness of breath and wheezing.   Cardiovascular: Negative for chest pain, palpitations, orthopnea and claudication.  Gastrointestinal: Negative for abdominal pain, blood in stool, constipation, diarrhea, heartburn, melena, nausea and vomiting.  Genitourinary: Negative for dysuria, flank pain, frequency, hematuria and urgency.  Musculoskeletal: Positive for joint pain. Negative for back pain and myalgias.  Skin: Negative for rash.  Neurological: Negative for dizziness, tingling, focal weakness, seizures, weakness and headaches.  Endo/Heme/Allergies: Does not bruise/bleed easily.  Psychiatric/Behavioral: Negative for depression and suicidal ideas. The patient does not have insomnia.       Allergies  Allergen Reactions  . Tape Other (See Comments)    Tears skin/redness (PAPER TAPE OK and tegaderm)_     Past Medical History:  Diagnosis Date  . Breast cancer ([place]) 2020   right breast  . Dysrhythmia    tachycardia  . Family history of breast cancer   . Family history of prostate cancer   . GERD (gastroesophageal reflux disease)   . Hypertension   . Personal history of radiation therapy   . Vitamin D deficiency      Past Surgical History:  Procedure Laterality Date  . ABDOMINAL HYSTERECTOMY  complete  . BREAST BIOPSY Left 2013   negative core  . BREAST BIOPSY Right 2021    INVASIVE MAMMARY CARCINOMA Grade 1. Ductal carcinoma in situ: Present, low-grade  . BREAST EXCISIONAL BIOPSY Right 01/01/2020   RF tag placement [id number]  . BREAST LUMPECTOMY Right 01/07/2020  . CESAREAN SECTION    . INCONTINENCE SURGERY    . NASAL SINUS SURGERY Left 2010  . PARTIAL MASTECTOMY WITH NEEDLE LOCALIZATION AND AXILLARY SENTINEL LYMPH NODE BX Right 01/07/2020   Procedure: PARTIAL MASTECTOMY WITH RADIOFREQUENCY TAG, RIGHT AXILLARY SENTINEL LYMPH NODE BIOPSY;  Surgeon: Herbert Pun, MD;  Location: [facility] ORS;  Service: General;  Laterality: Right;  . TONSILLECTOMY      Social History   Socioeconomic History  . Marital status: Divorced    Spouse name: Not on file  . Number of children: Not on file  . Years of education: Not on file  . Highest education level: Not on file  Occupational History  . Not on file  Tobacco Use  . Smoking status: Former Research scientist (life sciences)  . Smokeless tobacco: Never Used  . Tobacco comment: 33 years ago  Vaping Use  . Vaping Use: Never used  Substance and Sexual Activity  . Alcohol use: Not Currently  . Drug use: Never  . Sexual activity: Not on file  Other Topics Concern  . Not on file  Social History Narrative  . Not on file   Social Determinants of Health   Financial Resource Strain: Not on file  Food Insecurity: Not on file  Transportation Needs: Not on file  Physical Activity: Not on file  Stress: Not on file  Social Connections: Not on file  Intimate Partner Violence: Not on file    Family History  Problem Relation Age of Onset  . Breast cancer Maternal Grandmother        dx late 77s  . Prostate cancer Father        dx 59s  . Prostate cancer Brother        dx 40s-50s  . Cancer Paternal Grandmother        tumor on ureter dx 80s     Current Outpatient Medications:  .  acetaminophen (TYLENOL) 500 MG tablet, Take 500-1,000 mg by mouth every 6 (six) hours as needed (for pain.)., Disp: , Rfl:  .  anastrozole (ARIMIDEX) 1 MG  tablet, TAKE 1 TABLET (1 MG TOTAL) BY MOUTH DAILY., Disp: 30 tablet, Rfl: 3 .  B Complex Vitamins (VITAMIN B COMPLEX PO), Take 1 tablet by mouth once a week., Disp: , Rfl:  .  calcium carbonate (TUMS - DOSED IN MG ELEMENTAL CALCIUM) 500 MG chewable tablet, Chew 1-2 tablets by mouth daily as needed for indigestion or heartburn., Disp: , Rfl:  .  Calcium-Magnesium-Vitamin D (CALCIUM 1200+D3 PO), Take 1 Dose by mouth daily., Disp: , Rfl:  .  Cholecalciferol 25 MCG (1000 UT) tablet, Take 1,000 Units by mouth 2 (two) times a week. , Disp: , Rfl:  .  hydrochlorothiazide (MICROZIDE) 12.5 MG capsule, Take 12.5 mg by mouth daily as needed (elevated blood pressure/fluid retention.). , Disp: , Rfl:  .  levothyroxine (SYNTHROID) 50 MCG tablet, TAKE 1 TABLET BY MOUTH ONCE DAILY TAKE ON AN EMPTY STOMACH WITH A GLASS OF WATER AT LEAST 30-60 MINUTES BEFORE BREAKFAST., Disp: 30 tablet, Rfl: 11 .  metoprolol succinate (TOPROL-XL) 25 MG 24 hr tablet, TAKE 1/2 TABLET BY MOUTH THREE TIMES A WEEK, Disp: 36 tablet, Rfl: 3 .  sertraline (ZOLOFT) 50  MG tablet, TAKE 1 TABLET (50 MG TOTAL) BY MOUTH ONCE DAILY, Disp: 90 tablet, Rfl: 3 .  traZODone (DESYREL) 50 MG tablet, TAKE 1 TABLET BY MOUTH NIGHTLY, Disp: 90 tablet, Rfl: 3 .  tretinoin (RETIN-A) 0.05 % cream, APPLY PEA SIZE AMOUNT TO FACE NIGHTLY, Disp: 20 g, Rfl: 3 .  metoprolol succinate (TOPROL-XL) 25 MG 24 hr tablet, Take 12.5 mg by mouth daily as needed (elevated blood pressure.). , Disp: , Rfl:  .  traZODone (DESYREL) 50 MG tablet, Take 25 mg by mouth every other day. At night, Disp: , Rfl:   Physical exam:  Vitals:   10/30/20 0953  BP: (!) 147/80  Pulse: 65  Temp: (!) 97.1 F (36.2 C)  TempSrc: Tympanic  Weight: 136 lb 14.4 oz (62.1 kg)   Physical Exam Constitutional:      General: She is not in acute distress. Cardiovascular:     Rate and Rhythm: Normal rate.  Pulmonary:     Effort: Pulmonary effort is normal.  Skin:    General: Skin is warm and dry.   Neurological:     Mental Status: She is alert and oriented to person, place, and time.      CMP Latest Ref Rng & Units 10/30/2020  Glucose 70 - 99 mg/dL 106(H)  BUN 6 - 20 mg/dL 17  Creatinine 0.44 - 1.00 mg/dL 0.80  Sodium 135 - 145 mmol/L 140  Potassium 3.5 - 5.1 mmol/L 4.1  Chloride 98 - 111 mmol/L 103  CO2 22 - 32 mmol/L 29  Calcium 8.9 - 10.3 mg/dL 9.1  Total Protein 6.5 - 8.1 g/dL 6.5  Total Bilirubin 0.3 - 1.2 mg/dL 0.6  Alkaline Phos 38 - 126 U/L 77  AST 15 - 41 U/L 17  ALT 0 - 44 U/L 13   CBC Latest Ref Rng & Units 10/30/2020  WBC 4.0 - 10.5 K/uL 6.1  Hemoglobin 12.0 - 15.0 g/dL 12.1  Hematocrit 36.0 - 46.0 % 36.1  Platelets 150 - 400 K/uL 238           MM DIAG BREAST TOMO BILATERAL  Result Date: 10/24/2020 CLINICAL DATA:  RIGHT lumpectomy 2021 EXAM: DIGITAL DIAGNOSTIC BILATERAL MAMMOGRAM WITH TOMOSYNTHESIS AND CAD TECHNIQUE: Bilateral digital diagnostic mammography and breast tomosynthesis was performed. The images were evaluated with computer-aided detection. COMPARISON:  Previous exam(s). ACR Breast Density Category b: There are scattered areas of fibroglandular density. FINDINGS: There is density and architectural distortion within the RIGHT breast, consistent with postsurgical changes. These are new in comparison to prior. No suspicious mass, distortion, or microcalcifications are identified to suggest presence of malignancy. IMPRESSION: No mammographic evidence of malignancy. RECOMMENDATION: Recommend bilateral diagnostic mammogram in 1 year. I have discussed the findings and recommendations with the patient. If applicable, a reminder letter will be sent to the patient regarding the next appointment. BI-RADS CATEGORY  2: Benign. Electronically Signed   By: Valentino Saxon MD   On: 10/24/2020 15:24     Assessment and plan- Patient is a 60 y.o. female  with stage I ER positive right breast cancer s/p lumpectomy and adjuvant radiation treatment and currently on  Arimidex.  She is here for routine follow-up of breast cancer  Clinically patient is doing well and tolerating Arimidex along with calcium and vitamin D without any significant side effects.  Recent mammogram from April 2022 showed no evidence of malignancy.  Breast exam therefore not performed today.  I will see her back in 6 months for in person breast  exam and check her vitamin D levels at that time as well.   Visit Diagnosis 1. Malignant neoplasm of upper-outer quadrant of right breast in female, estrogen receptor positive ([place])   2. Osteopenia of neck of left femur   3. Visit for monitoring Arimidex therapy      Dr. Randa Evens, MD, MPH [facility] [phone] 10/30/2020 4:05 PM

## 2020-11-03 ENCOUNTER — Encounter: Payer: Self-pay | Admitting: Oncology

## 2020-11-03 NOTE — Telephone Encounter (Signed)
On hormone therapy- 6 months

## 2020-11-04 ENCOUNTER — Other Ambulatory Visit (HOSPITAL_BASED_OUTPATIENT_CLINIC_OR_DEPARTMENT_OTHER): Payer: Self-pay

## 2020-11-05 ENCOUNTER — Other Ambulatory Visit: Payer: Self-pay

## 2020-11-05 MED FILL — Levothyroxine Sodium Tab 50 MCG: ORAL | 30 days supply | Qty: 30 | Fill #0 | Status: AC

## 2020-11-24 ENCOUNTER — Other Ambulatory Visit: Payer: Self-pay

## 2020-11-24 MED FILL — Anastrozole Tab 1 MG: ORAL | 30 days supply | Qty: 30 | Fill #0 | Status: AC

## 2020-12-06 MED FILL — Levothyroxine Sodium Tab 50 MCG: ORAL | 30 days supply | Qty: 30 | Fill #1 | Status: AC

## 2020-12-08 ENCOUNTER — Other Ambulatory Visit: Payer: Self-pay

## 2020-12-17 ENCOUNTER — Telehealth: Payer: Self-pay | Admitting: Pharmacy Technician

## 2020-12-18 NOTE — Telephone Encounter (Signed)
error 

## 2020-12-31 ENCOUNTER — Other Ambulatory Visit: Payer: Self-pay

## 2020-12-31 ENCOUNTER — Ambulatory Visit (INDEPENDENT_AMBULATORY_CARE_PROVIDER_SITE_OTHER): Payer: No Typology Code available for payment source | Admitting: Urology

## 2020-12-31 VITALS — BP 109/72 | HR 73 | Ht 63.0 in | Wt 134.0 lb

## 2020-12-31 DIAGNOSIS — R3129 Other microscopic hematuria: Secondary | ICD-10-CM

## 2020-12-31 DIAGNOSIS — R3915 Urgency of urination: Secondary | ICD-10-CM

## 2020-12-31 LAB — URINALYSIS, COMPLETE
Bilirubin, UA: NEGATIVE
Glucose, UA: NEGATIVE
Ketones, UA: NEGATIVE
Leukocytes,UA: NEGATIVE
Nitrite, UA: NEGATIVE
Protein,UA: NEGATIVE
Specific Gravity, UA: <1.005 — ABNORMAL LOW (ref 1.005–1.030)
Urobilinogen, Ur: 0.2 mg/dL (ref 0.2–1.0)
pH, UA: 6 (ref 5.0–7.5)

## 2020-12-31 LAB — MICROSCOPIC EXAMINATION
Bacteria, UA: NONE SEEN
Epithelial Cells (non renal): NONE SEEN /hpf (ref 0–10)

## 2020-12-31 NOTE — Progress Notes (Signed)
12/31/2020 2:47 PM   Sheri [name] Sep 17, 1960 [id number]  Referring provider: Rusty Aus, MD [facility] [place],  [place] [address]  Chief Complaint  Patient presents with  . Hematuria    HPI: 60 year old female who presents today for further evaluation of incidental microscopic hematuria.  She underwent routine urinalysis at her primary care's in May.  She 4010 red blood cells per high-powered field in the absence of infection, urine was otherwise unremarkable.  She does have a personal history of urinary urgency and frequency.  She reports that she had to have her bladder "tacked" at the time of hysterectomy due to prolapse as well as urinary frequency.  Initially, she had some improvement in her urinary symptoms but these have recurred.  She reports occasions where she puts her keys in the front door and the feel like she is going to have an accident.  She does drink caffeinated beverages including coffee in the mornings.  She notes this makes her urinary symptoms worse.  She does have a personal history of breast cancer currently on anastrozole.  No recent cross-sectional imaging.  No personal history of gross hematuria.  No flank pain.  No smoking history.  She does mention today that her father has a personal history of bladder cancer, he was a smoker.   PMH: Past Medical History:  Diagnosis Date  . Breast cancer ([place]) 2020   right breast  . Dysrhythmia    tachycardia  . Family history of breast cancer   . Family history of prostate cancer   . GERD (gastroesophageal reflux disease)   . Hypertension   . Personal history of radiation therapy   . Vitamin D deficiency     Surgical History: Past Surgical History:  Procedure Laterality Date  . ABDOMINAL HYSTERECTOMY     complete  . BREAST BIOPSY Left 2013   negative core  . BREAST BIOPSY Right 2021   INVASIVE MAMMARY CARCINOMA Grade 1. Ductal carcinoma in situ:  Present, low-grade  . BREAST EXCISIONAL BIOPSY Right 01/01/2020   RF tag placement [id number]  . BREAST LUMPECTOMY Right 01/07/2020  . CESAREAN SECTION    . INCONTINENCE SURGERY    . NASAL SINUS SURGERY Left 2010  . PARTIAL MASTECTOMY WITH NEEDLE LOCALIZATION AND AXILLARY SENTINEL LYMPH NODE BX Right 01/07/2020   Procedure: PARTIAL MASTECTOMY WITH RADIOFREQUENCY TAG, RIGHT AXILLARY SENTINEL LYMPH NODE BIOPSY;  Surgeon: Herbert Pun, MD;  Location: [facility] ORS;  Service: General;  Laterality: Right;  . TONSILLECTOMY      Home Medications:  Allergies as of 12/31/2020      Reactions   Tape Other (See Comments)   Tears skin/redness (PAPER TAPE OK and tegaderm)_      Medication List       Accurate as of December 31, 2020  2:47 PM. If you have any questions, ask your nurse or doctor.        STOP taking these medications   sertraline 50 MG tablet Commonly known as: ZOLOFT Stopped by: Hollice Espy, MD     TAKE these medications   acetaminophen 500 MG tablet Commonly known as: TYLENOL Take 500-1,000 mg by mouth every 6 (six) hours as needed (for pain.).   anastrozole 1 MG tablet Commonly known as: ARIMIDEX TAKE 1 TABLET (1 MG TOTAL) BY MOUTH DAILY.   CALCIUM 1200+D3 PO Take 1 Dose by mouth daily.   calcium carbonate 500 MG chewable tablet Commonly known as: TUMS - dosed in  mg elemental calcium Chew 1-2 tablets by mouth daily as needed for indigestion or heartburn.   Cholecalciferol 25 MCG (1000 UT) tablet Take 1,000 Units by mouth 2 (two) times a week.   hydrochlorothiazide 12.5 MG capsule Commonly known as: MICROZIDE Take 12.5 mg by mouth daily as needed (elevated blood pressure/fluid retention.).   levothyroxine 50 MCG tablet Commonly known as: SYNTHROID TAKE 1 TABLET BY MOUTH ONCE DAILY TAKE ON AN EMPTY STOMACH WITH A GLASS OF WATER AT LEAST 30-60 MINUTES BEFORE BREAKFAST.   metoprolol succinate 25 MG 24 hr tablet Commonly known as: TOPROL-XL Take 12.5 mg by mouth  daily as needed (elevated blood pressure.). What changed: Another medication with the same name was removed. Continue taking this medication, and follow the directions you see here. Changed by: Hollice Espy, MD   traZODone 50 MG tablet Commonly known as: DESYREL Take 25 mg by mouth every other day. At night What changed: Another medication with the same name was removed. Continue taking this medication, and follow the directions you see here. Changed by: Hollice Espy, MD   tretinoin 0.05 % cream Commonly known as: RETIN-A APPLY PEA SIZE AMOUNT TO FACE NIGHTLY   VITAMIN B COMPLEX PO Take 1 tablet by mouth once a week.       Allergies:  Allergies  Allergen Reactions  . Tape Other (See Comments)    Tears skin/redness (PAPER TAPE OK and tegaderm)_    Family History: Family History  Problem Relation Age of Onset  . Breast cancer Maternal Grandmother        dx late 85s  . Prostate cancer Father        dx 30s  . Prostate cancer Brother        dx 40s-50s  . Cancer Paternal Grandmother        tumor on ureter dx 84s    Social History:  reports that she has quit smoking. She has never used smokeless tobacco. She reports previous alcohol use. She reports that she does not use drugs.   Physical Exam: BP 109/72   Pulse 73   Ht 5\' 3"  (1.6 m)   Wt 134 lb (60.8 kg)   BMI 23.74 kg/m   Constitutional:  Alert and oriented, No acute distress. HEENT: [place] AT, moist mucus membranes.  Trachea midline, no masses. Cardiovascular: No clubbing, cyanosis, or edema. Respiratory: Normal respiratory effort, no increased work of breathing. Skin: No rashes, bruises or suspicious lesions. Neurologic: Grossly intact, no focal deficits, moving all 4 extremities. Psychiatric: Normal mood and affect.  Laboratory Data: Lab Results  Component Value Date   WBC 6.1 10/30/2020   HGB 12.1 10/30/2020   HCT 36.1 10/30/2020   MCV 89.8 10/30/2020   PLT 238 10/30/2020    Lab Results  Component  Value Date   CREATININE 0.80 10/30/2020    Urinalysis Urinalysis today with 3-10 red blood cells per high-power field, otherwise unmarkable   Assessment & Plan:    1. Microscopic hematuria We discussed the differential diagnosis for microscopic hematuria including nephrolithiasis, renal or upper tract tumors, bladder stones, UTIs, or bladder tumors as well as undetermined etiologies. Per AUA guidelines, I did recommend complete microscopic hematuria evaluation including renal ultrasound and office cystoscopy.  - Urinalysis, Complete - Ultrasound renal complete; Future  2. Urinary urgency Baseline urinary urgency/frequency which is worsening  We discussed behavioral modification as primary intervention today  We also discussed pharmacotherapy, elects to manage conservatively for the time being  Will evaluate for recurrent  prolapse at the time of cystoscopy although not suspected in the absence of vaginal symptoms other than dryness   Hollice Espy, MD  [facility] [address] [place],  [address] [phone]

## 2020-12-31 NOTE — Patient Instructions (Signed)
Cystoscopy Cystoscopy is a procedure that is used to help diagnose and sometimes treat conditions that affect the lower urinary tract. The lower urinary tract includes the bladder and the urethra. The urethra is the tube that drains urine from the bladder. Cystoscopy is done using a thin, tube-shaped instrument with a light and camera at the end (cystoscope). The cystoscope may be hard or flexible, depending on the goal of the procedure. The cystoscope is inserted through the urethra, into the bladder. Cystoscopy may be recommended if you have:  Urinary tract infections that keep coming back.  Blood in the urine (hematuria).  An inability to control when you urinate (urinary incontinence) or an overactive bladder.  Unusual cells found in a urine sample.  A blockage in the urethra, such as a urinary stone.  Painful urination.  An abnormality in the bladder found during an intravenous pyelogram (IVP) or CT scan. Cystoscopy may also be done to remove a sample of tissue to be examined under a microscope (biopsy). What are the risks? Generally, this is a safe procedure. However, problems may occur, including:  Infection.  Bleeding.  What happens during the procedure?  1. You will be given one or more of the following: ? A medicine to numb the area (local anesthetic). 2. The area around the opening of your urethra will be cleaned. 3. The cystoscope will be passed through your urethra into your bladder. 4. Germ-free (sterile) fluid will flow through the cystoscope to fill your bladder. The fluid will stretch your bladder so that your health care provider can clearly examine your bladder walls. 5. Your doctor will look at the urethra and bladder. 6. The cystoscope will be removed The procedure may vary among health care providers  What can I expect after the procedure? After the procedure, it is common to have: 1. Some soreness or pain in your abdomen and urethra. 2. Urinary symptoms.  These include: ? Mild pain or burning when you urinate. Pain should stop within a few minutes after you urinate. This may last for up to 1 week. ? A small amount of blood in your urine for several days. ? Feeling like you need to urinate but producing only a small amount of urine. Follow these instructions at home: General instructions  Return to your normal activities as told by your health care provider.   Do not drive for 24 hours if you were given a sedative during your procedure.  Watch for any blood in your urine. If the amount of blood in your urine increases, call your health care provider.  If a tissue sample was removed for testing (biopsy) during your procedure, it is up to you to get your test results. Ask your health care provider, or the department that is doing the test, when your results will be ready.  Drink enough fluid to keep your urine pale yellow.  Keep all follow-up visits as told by your health care provider. This is important. Contact a health care provider if you:  Have pain that gets worse or does not get better with medicine, especially pain when you urinate.  Have trouble urinating.  Have more blood in your urine. Get help right away if you:  Have blood clots in your urine.  Have abdominal pain.  Have a fever or chills.  Are unable to urinate. Summary  Cystoscopy is a procedure that is used to help diagnose and sometimes treat conditions that affect the lower urinary tract.  Cystoscopy is done using   a thin, tube-shaped instrument with a light and camera at the end.  After the procedure, it is common to have some soreness or pain in your abdomen and urethra.  Watch for any blood in your urine. If the amount of blood in your urine increases, call your health care provider.  If you were prescribed an antibiotic medicine, take it as told by your health care provider. Do not stop taking the antibiotic even if you start to feel better. This  information is not intended to replace advice given to you by your health care provider. Make sure you discuss any questions you have with your health care provider. Document Revised: 07/04/2018 Document Reviewed: 07/04/2018 Elsevier Patient Education  2020 [employer]vier Inc[employer]   

## 2021-01-05 ENCOUNTER — Other Ambulatory Visit: Payer: Self-pay

## 2021-01-05 ENCOUNTER — Other Ambulatory Visit: Payer: Self-pay | Admitting: Oncology

## 2021-01-05 MED FILL — Levothyroxine Sodium Tab 50 MCG: ORAL | 30 days supply | Qty: 30 | Fill #2 | Status: AC

## 2021-01-05 MED FILL — Anastrozole Tab 1 MG: ORAL | 30 days supply | Qty: 30 | Fill #0 | Status: AC

## 2021-01-21 ENCOUNTER — Other Ambulatory Visit: Payer: Self-pay

## 2021-01-21 DIAGNOSIS — Z170 Estrogen receptor positive status [ER+]: Secondary | ICD-10-CM

## 2021-01-28 ENCOUNTER — Ambulatory Visit
Admission: RE | Admit: 2021-01-28 | Discharge: 2021-01-28 | Disposition: A | Discharge status: Home / Self Care | Payer: No Typology Code available for payment source | Source: Ambulatory Visit | Attending: Urology | Admitting: Urology

## 2021-01-28 ENCOUNTER — Other Ambulatory Visit: Payer: Self-pay

## 2021-01-28 DIAGNOSIS — R3129 Other microscopic hematuria: Secondary | ICD-10-CM | POA: Insufficient documentation

## 2021-02-04 ENCOUNTER — Ambulatory Visit (INDEPENDENT_AMBULATORY_CARE_PROVIDER_SITE_OTHER): Payer: No Typology Code available for payment source | Admitting: Urology

## 2021-02-04 ENCOUNTER — Other Ambulatory Visit: Payer: Self-pay

## 2021-02-04 ENCOUNTER — Encounter: Payer: Self-pay | Admitting: Urology

## 2021-02-04 ENCOUNTER — Encounter: Payer: Self-pay | Admitting: Oncology

## 2021-02-04 VITALS — BP 124/74 | HR 67 | Ht 63.0 in | Wt 138.0 lb

## 2021-02-04 DIAGNOSIS — R3915 Urgency of urination: Secondary | ICD-10-CM

## 2021-02-04 DIAGNOSIS — R3129 Other microscopic hematuria: Secondary | ICD-10-CM

## 2021-02-04 LAB — MICROSCOPIC EXAMINATION
Bacteria, UA: NONE SEEN
Epithelial Cells (non renal): NONE SEEN /hpf (ref 0–10)
WBC, UA: NONE SEEN /hpf (ref 0–5)

## 2021-02-04 LAB — URINALYSIS, COMPLETE
Bilirubin, UA: NEGATIVE
Glucose, UA: NEGATIVE
Ketones, UA: NEGATIVE
Leukocytes,UA: NEGATIVE
Nitrite, UA: NEGATIVE
Protein,UA: NEGATIVE
Specific Gravity, UA: 1.005 (ref 1.005–1.030)
Urobilinogen, Ur: 0.2 mg/dL (ref 0.2–1.0)
pH, UA: 6 (ref 5.0–7.5)

## 2021-02-04 MED ORDER — OXYBUTYNIN CHLORIDE ER 10 MG PO TB24
10.0000 mg | ORAL_TABLET | Freq: Every day | ORAL | 11 refills | Status: DC
  Filled 2021-02-04: qty 30, 30d supply, fill #0

## 2021-02-04 NOTE — Progress Notes (Signed)
   02/04/21  CC:  Chief Complaint  Patient presents with   Cysto    HPI: 53 female with microscopic hematuria who presents today for cystoscopic evaluation.  Renal ultrasound completed on 01/29/2021 1.6 cm left renal cyst, otherwise, no upper tract pathology identified.  This was simple in nature.  Vitals:   02/04/21 0937  BP: 124/74  Pulse: 67   NED. A&Ox3.   No respiratory distress   Abd soft, NT, ND Normal external genitalia with patent urethral meatus Pelvic exam performed chaperoned by Fonnie Jarvis, CMA.  Using a single spec, the bladder was noted to have no prolapse or descent without evidence of prolapse.  Cystoscopy Procedure Note  Patient identification was confirmed, informed consent was obtained, and patient was prepped using Betadine solution.  Lidocaine jelly was administered per urethral meatus.    Procedure: - Flexible cystoscope introduced, without any difficulty.   - Thorough search of the bladder revealed:    normal urethral meatus    normal urothelium    no stones    no ulcers     no tumors    no urethral polyps    no trabeculation  - Ureteral orifices were normal in position and appearance.  Post-Procedure: - Patient tolerated the procedure well  Assessment/ Plan:  1. Microscopic hematuria Status post negative evaluation including cystoscopy and renal ultrasound which is reassuring  Etiology is unclear, will continue to monitor and reassess as deemed appropriate - Urinalysis, Complete  2. Urinary urgency Baseline urinary urgency with failure to respond to behavioral modification  No evidence of any bladder pathology or prolapse  We discussed alternatives today including the addition of anticholinergic or beta 3 agonist to help with her urinary symptoms.  She is interested in trying 1 of these medications.  We will start with a low-dose oxybutynin 10 mg XL daily.  Reassess symptoms in 4 weeks with PVR.  We discussed possible side effects  including dry eyes dry mouth and constipation.    Return in about 4 weeks (around 03/04/2021) for 24mo w/PVR.  Hollice Espy, MD

## 2021-02-09 MED FILL — Levothyroxine Sodium Tab 50 MCG: ORAL | 30 days supply | Qty: 30 | Fill #3 | Status: AC

## 2021-02-10 ENCOUNTER — Other Ambulatory Visit: Payer: Self-pay

## 2021-03-01 MED FILL — Anastrozole Tab 1 MG: ORAL | 30 days supply | Qty: 30 | Fill #1 | Status: AC

## 2021-03-02 ENCOUNTER — Ambulatory Visit: Payer: No Typology Code available for payment source | Admitting: Oncology

## 2021-03-02 ENCOUNTER — Other Ambulatory Visit: Payer: No Typology Code available for payment source

## 2021-03-02 ENCOUNTER — Other Ambulatory Visit: Payer: Self-pay

## 2021-03-02 MED FILL — Trazodone HCl Tab 50 MG: ORAL | 90 days supply | Qty: 90 | Fill #0 | Status: AC

## 2021-03-03 ENCOUNTER — Other Ambulatory Visit: Payer: Self-pay

## 2021-03-03 ENCOUNTER — Ambulatory Visit: Payer: Self-pay | Admitting: Physician Assistant

## 2021-03-05 ENCOUNTER — Other Ambulatory Visit: Payer: Self-pay

## 2021-03-05 ENCOUNTER — Ambulatory Visit: Payer: Self-pay | Admitting: Physician Assistant

## 2021-03-05 ENCOUNTER — Telehealth: Payer: Self-pay

## 2021-03-05 DIAGNOSIS — Z170 Estrogen receptor positive status [ER+]: Secondary | ICD-10-CM

## 2021-03-05 DIAGNOSIS — C50411 Malignant neoplasm of upper-outer quadrant of right female breast: Secondary | ICD-10-CM

## 2021-03-05 NOTE — Telephone Encounter (Signed)
Research nurse left message on secure voice mail for patient reminding her to be fasting for at least 3 hours prior to her lab appointment in the am at 09:45. Encouraged patient to call if she has any questions or concerns. Jeral Fruit, RN 03/05/21 10:21 AM

## 2021-03-06 ENCOUNTER — Encounter: Payer: Self-pay | Admitting: Oncology

## 2021-03-06 ENCOUNTER — Inpatient Hospital Stay (HOSPITAL_BASED_OUTPATIENT_CLINIC_OR_DEPARTMENT_OTHER): Payer: No Typology Code available for payment source | Admitting: Oncology

## 2021-03-06 ENCOUNTER — Inpatient Hospital Stay: Payer: No Typology Code available for payment source | Attending: Oncology

## 2021-03-06 VITALS — BP 126/72 | HR 63 | Temp 96.6°F | Resp 20 | Wt 134.8 lb

## 2021-03-06 DIAGNOSIS — Z79811 Long term (current) use of aromatase inhibitors: Secondary | ICD-10-CM

## 2021-03-06 DIAGNOSIS — C50411 Malignant neoplasm of upper-outer quadrant of right female breast: Secondary | ICD-10-CM

## 2021-03-06 DIAGNOSIS — I10 Essential (primary) hypertension: Secondary | ICD-10-CM | POA: Insufficient documentation

## 2021-03-06 DIAGNOSIS — Z5181 Encounter for therapeutic drug level monitoring: Secondary | ICD-10-CM | POA: Diagnosis not present

## 2021-03-06 DIAGNOSIS — Z170 Estrogen receptor positive status [ER+]: Secondary | ICD-10-CM

## 2021-03-06 DIAGNOSIS — M85852 Other specified disorders of bone density and structure, left thigh: Secondary | ICD-10-CM

## 2021-03-06 LAB — VITAMIN D 25 HYDROXY (VIT D DEFICIENCY, FRACTURES): Vit D, 25-Hydroxy: 56.94 ng/mL (ref 30–100)

## 2021-03-06 LAB — COMPREHENSIVE METABOLIC PANEL
ALT: 13 U/L (ref 0–44)
AST: 18 U/L (ref 15–41)
Albumin: 3.9 g/dL (ref 3.5–5.0)
Alkaline Phosphatase: 81 U/L (ref 38–126)
Anion gap: 9 (ref 5–15)
BUN: 12 mg/dL (ref 6–20)
CO2: 29 mmol/L (ref 22–32)
Calcium: 9.4 mg/dL (ref 8.9–10.3)
Chloride: 99 mmol/L (ref 98–111)
Creatinine, Ser: 0.9 mg/dL (ref 0.44–1.00)
GFR, Estimated: >60 mL/min (ref >60)
Glucose, Bld: 116 mg/dL — ABNORMAL HIGH (ref 70–99)
Potassium: 4.4 mmol/L (ref 3.5–5.1)
Sodium: 137 mmol/L (ref 135–145)
Total Bilirubin: 0.7 mg/dL (ref 0.3–1.2)
Total Protein: 7 g/dL (ref 6.5–8.1)

## 2021-03-06 LAB — CBC WITH DIFFERENTIAL/PLATELET
Abs Immature Granulocytes: 0.03 10*3/uL (ref 0.00–0.07)
Basophils Absolute: 0 10*3/uL (ref 0.0–0.1)
Basophils Relative: 1 %
Eosinophils Absolute: 0.2 10*3/uL (ref 0.0–0.5)
Eosinophils Relative: 3 %
HCT: 38.4 % (ref 36.0–46.0)
Hemoglobin: 12.7 g/dL (ref 12.0–15.0)
Immature Granulocytes: 1 %
Lymphocytes Relative: 17 %
Lymphs Abs: 1 10*3/uL (ref 0.7–4.0)
MCH: 30.1 pg (ref 26.0–34.0)
MCHC: 33.1 g/dL (ref 30.0–36.0)
MCV: 91 fL (ref 80.0–100.0)
Monocytes Absolute: 0.4 10*3/uL (ref 0.1–1.0)
Monocytes Relative: 7 %
Neutro Abs: 4.4 10*3/uL (ref 1.7–7.7)
Neutrophils Relative %: 71 %
Platelets: 249 10*3/uL (ref 150–400)
RBC: 4.22 MIL/uL (ref 3.87–5.11)
RDW: 12.6 % (ref 11.5–15.5)
WBC: 6 10*3/uL (ref 4.0–10.5)
nRBC: 0 % (ref 0.0–0.2)

## 2021-03-06 NOTE — Research (Addendum)
[id number] Upbeat 12 Month Visit:   Patient in to the cancer center today for her 12 month visit for the "Upbeat" protocol with Dr. Janese Banks. The patient was weighed with a calibrated scale set at zero without her shoes or any heavy items. Weight # 1 was 134.1, re-weight was 134.5 lbs. Height was obtained without her shoes on per protocol at 62.5 in or 5'2.5". Patient was taken to the exam room and allowed to sit for approximately five minutes prior to her blood pressure being obtained. Blood pressure was obtained in her left arm, sitting with her legs straight down on the floor. Her first blood pressure was 126/72 pulse-63. Her temperature was 96.6 and oxygen saturations were 100 %. Waist circumference was measured at 33.5 inches per the protocol specifications. Her CV medication review form and other medication review forms were completed, as well as the cardiac evaluation form. The patient denies any cardiac events or hospitalizations since she was here last. Her medications were reviewed with Judeen Hammans, RN and the patient states she has not changed any medications except for oxybutinin which she is not going to take for spasms.  Dr. Janese Banks examined the patient today, including breast exam. The patient was escorted to the research department to complete the protocol required testing. The six minute walk, disability measures, and expanded SPPB tests were completed without any difficulty for the patient with the assistance of [employer], [employer].  She was able to walk 15 3/4  laps in the 6 minute walk, and hold her balance for longer than 10 seconds on all the SPPB tests today without any gait issues. The 12 month neurocognitive tests were administered without difficulty as well. The patient then completed her Covid-19 and [facility] Cardiomyopathy questionnaires.  The patient was given her [employer] gift card after completion of all activities today by [employer], [place]. The schedule for her cardiac MRI was reviewed with the  patient and reminded it would be scheduled around this time next year for her 24 month visit.  The patient's protocol scheduled was reviewed and she is aware of her 24 month visit, and the same research activities at that time. The patient was thanked for her participation in the clinical trial and encouraged to call the research department if she has any questions or concerns. Approximately one and one half hour was spent with this patient to complete her research activities today. Jeral Fruit, RN 03/06/21 12:44 PM

## 2021-03-06 NOTE — Progress Notes (Signed)
Hematology/Oncology Consult note [facility]  Telephone:([phone] [phone]  Patient Care Team: Rusty Aus, MD as PCP - General (Internal Medicine) Sindy Guadeloupe, MD as Consulting Physician (Oncology) Herbert Pun, MD as Consulting Physician (General Surgery) Noreene Filbert, MD as Referring Physician (Radiation Oncology) Rico Junker, RN as Registered Nurse Jeral Fruit, RN as Registered Nurse   Name of the patient: Sheri [name]  [id number]  05-11-1961   Date of visit: 03/06/21  Diagnosis- pathological prognostic stage Ia right breast cancer mpT1b pN0 cM0 ER/PR positive HER-2/neu negative s/p lumpectomy  Chief complaint/ Reason for visit-routine follow-up of breast cancer on Arimidex  Heme/Onc history: Patient is a 60 year old female with a past medical history significant for hypertension who recently underwent a screening mammogram on 10/18/2019 which showed a possible mass in the right breast.  This was followed by a diagnostic mammogram and ultrasound which showed hypoechoic mass measuring 0.8 x 0.8 x 0.9 cm at 10 o'clock position in the right breast 7 cm from the nipple.  Right axilla demonstrates normal-appearing lymph nodes.  This was biopsied and was consistent with invasive mammary carcinoma grade 1, 8 mm ER 91 -100% positive, PR 11 to 20% positive and HER-2/neu negative.    Patient is doing well for her age and denies any complaints at this time.  Menarche at the age of 61.  She used hormone replacement therapy for a few years in the past.  G2, P2 L2.  No prior breast biopsies.  Age at first pregnancy 28 years.  Menopause in her 46s.   Final pathology showed 2 separate foci of tumor 5 mm and 8 mm in size.  No malignancy in the intervening tissue.  4 sentinel lymph nodes negative for malignancy.  Margins negative.  Tumor was overall grade 1 ER/PR positive and HER-2 negative patient completed adjuvant radiation  treatment and Started Arimidex in September 2021.  Interval history-patient reports doing well on Arimidex so far.  She had an episode where she had pain in her right lower extremity and she was not sure if symptoms were secondary to Arimidex or not.  She stopped it for about a week and then restarted it without any problems.  She also saw urology for symptoms of microscopic hematuria which was worked up by cystoscopy and was overall negative  ECOG PS- 0 Pain scale- 0   Review of systems- ROS    Allergies  Allergen Reactions   Tape Other (See Comments)    Tears skin/redness (PAPER TAPE OK and tegaderm)_     Past Medical History:  Diagnosis Date   Breast cancer ([place]) 2020   right breast   Dysrhythmia    tachycardia   Family history of breast cancer    Family history of prostate cancer    GERD (gastroesophageal reflux disease)    Hypertension    Personal history of radiation therapy    Vitamin D deficiency      Past Surgical History:  Procedure Laterality Date   ABDOMINAL HYSTERECTOMY     complete   BREAST BIOPSY Left 2013   negative core   BREAST BIOPSY Right 2021   INVASIVE MAMMARY CARCINOMA Grade 1. Ductal carcinoma in situ: Present, low-grade   BREAST EXCISIONAL BIOPSY Right 01/01/2020   RF tag placement [id number]   BREAST LUMPECTOMY Right 01/07/2020   CESAREAN SECTION     INCONTINENCE SURGERY     NASAL SINUS SURGERY Left 2010   PARTIAL MASTECTOMY WITH NEEDLE  LOCALIZATION AND AXILLARY SENTINEL LYMPH NODE BX Right 01/07/2020   Procedure: PARTIAL MASTECTOMY WITH RADIOFREQUENCY TAG, RIGHT AXILLARY SENTINEL LYMPH NODE BIOPSY;  Surgeon: Herbert Pun, MD;  Location: [facility] ORS;  Service: General;  Laterality: Right;   TONSILLECTOMY      Social History   Socioeconomic History   Marital status: Divorced    Spouse name: Not on file   Number of children: Not on file   Years of education: Not on file   Highest education level: Not on file  Occupational History    Not on file  Tobacco Use   Smoking status: Former   Smokeless tobacco: Never   Tobacco comments:    33 years ago  Vaping Use   Vaping Use: Never used  Substance and Sexual Activity   Alcohol use: Not Currently   Drug use: Never   Sexual activity: Not on file  Other Topics Concern   Not on file  Social History Narrative   Not on file   Social Determinants of Health   Financial Resource Strain: Not on file  Food Insecurity: Not on file  Transportation Needs: Not on file  Physical Activity: Not on file  Stress: Not on file  Social Connections: Not on file  Intimate Partner Violence: Not on file    Family History  Problem Relation Age of Onset   Breast cancer Maternal Grandmother        dx late 56s   Prostate cancer Father        dx 34s   Prostate cancer Brother        dx 60s-50s   Cancer Paternal Grandmother        tumor on ureter dx 80s     Current Outpatient Medications:    acetaminophen (TYLENOL) 500 MG tablet, Take 500-1,000 mg by mouth every 6 (six) hours as needed (for pain.)., Disp: , Rfl:    anastrozole (ARIMIDEX) 1 MG tablet, TAKE 1 TABLET (1 MG TOTAL) BY MOUTH DAILY., Disp: 30 tablet, Rfl: 3   B Complex Vitamins (VITAMIN B COMPLEX PO), Take 1 tablet by mouth once a week., Disp: , Rfl:    calcium carbonate (TUMS - DOSED IN MG ELEMENTAL CALCIUM) 500 MG chewable tablet, Chew 1-2 tablets by mouth daily as needed for indigestion or heartburn., Disp: , Rfl:    Calcium-Magnesium-Vitamin D (CALCIUM 1200+D3 PO), Take 1 Dose by mouth daily., Disp: , Rfl:    Cholecalciferol 25 MCG (1000 UT) tablet, Take 1,000 Units by mouth 2 (two) times a week. , Disp: , Rfl:    hydrochlorothiazide (MICROZIDE) 12.5 MG capsule, Take 12.5 mg by mouth daily as needed (elevated blood pressure/fluid retention.). , Disp: , Rfl:    levothyroxine (SYNTHROID) 50 MCG tablet, TAKE 1 TABLET BY MOUTH ONCE DAILY TAKE ON AN EMPTY STOMACH WITH A GLASS OF WATER AT LEAST 30-60 MINUTES BEFORE BREAKFAST.,  Disp: 30 tablet, Rfl: 11   metoprolol succinate (TOPROL-XL) 25 MG 24 hr tablet, Take 12.5 mg by mouth daily as needed (elevated blood pressure.). , Disp: , Rfl:    oxybutynin (DITROPAN-XL) 10 MG 24 hr tablet, Take 1 tablet (10 mg total) by mouth daily., Disp: 30 tablet, Rfl: 11   traZODone (DESYREL) 50 MG tablet, TAKE 1 TABLET BY MOUTH NIGHTLY, Disp: 90 tablet, Rfl: 3   tretinoin (RETIN-A) 0.05 % cream, APPLY PEA SIZE AMOUNT TO FACE NIGHTLY, Disp: 20 g, Rfl: 3  Physical exam:  Vitals:   03/06/21 1044  BP: 126/72  Pulse: 63  Resp: 20  Temp: (!) 96.6 F (35.9 C)  TempSrc: Tympanic  SpO2: 100%  Weight: 134 lb 12.8 oz (61.1 kg)   Physical Exam Cardiovascular:     Rate and Rhythm: Normal rate and regular rhythm.     Heart sounds: Normal heart sounds.  Pulmonary:     Effort: Pulmonary effort is normal.     Breath sounds: Normal breath sounds.  Abdominal:     General: Bowel sounds are normal.     Palpations: Abdomen is soft.  Skin:    General: Skin is warm and dry.  Neurological:     Mental Status: She is alert and oriented to person, place, and time.   Breast exam was performed in seated and lying down position. Patient is status post right lumpectomy with a well-healed surgical scar. No evidence of any palpable masses. No evidence of axillary adenopathy. No evidence of any palpable masses or lumps in the left breast. No evidence of leftt axillary adenopathy   CMP Latest Ref Rng & Units 03/06/2021  Glucose 70 - 99 mg/dL 116(H)  BUN 6 - 20 mg/dL 12  Creatinine 0.44 - 1.00 mg/dL 0.90  Sodium 135 - 145 mmol/L 137  Potassium 3.5 - 5.1 mmol/L 4.4  Chloride 98 - 111 mmol/L 99  CO2 22 - 32 mmol/L 29  Calcium 8.9 - 10.3 mg/dL 9.4  Total Protein 6.5 - 8.1 g/dL 7.0  Total Bilirubin 0.3 - 1.2 mg/dL 0.7  Alkaline Phos 38 - 126 U/L 81  AST 15 - 41 U/L 18  ALT 0 - 44 U/L 13   CBC Latest Ref Rng & Units 03/06/2021  WBC 4.0 - 10.5 K/uL 6.0  Hemoglobin 12.0 - 15.0 g/dL 12.7  Hematocrit  36.0 - 46.0 % 38.4  Platelets 150 - 400 K/uL 249   Assessment and plan- Patient is a 60 y.o. female with stage I ER positive right breast cancer s/p lumpectomy and adjuvant radiation treatment and currently on Arimidex.  This is a routine follow-up visit  Clinically patient is doing well with no concerning signs and symptoms of recurrence based on today's exam.She is tolerating Arimidex along with calcium and vitamin D well without any significant side effects.  Labs are unremarkable.  I will see her back in 6 months for in person or video visit.  Mammogram in April 2023 which we will schedule.   Visit Diagnosis 1. Malignant neoplasm of upper-outer quadrant of right breast in female, estrogen receptor positive ([place])   2. Visit for monitoring Arimidex therapy      Dr. Randa Evens, MD, MPH [facility] [phone] 03/06/2021 4:35 PM

## 2021-03-15 MED FILL — Levothyroxine Sodium Tab 50 MCG: ORAL | 30 days supply | Qty: 30 | Fill #4 | Status: AC

## 2021-03-16 ENCOUNTER — Other Ambulatory Visit: Payer: Self-pay

## 2021-04-13 MED FILL — Levothyroxine Sodium Tab 50 MCG: ORAL | 30 days supply | Qty: 30 | Fill #5 | Status: CN

## 2021-04-14 ENCOUNTER — Other Ambulatory Visit: Payer: Self-pay

## 2021-04-14 MED FILL — Levothyroxine Sodium Tab 50 MCG: ORAL | 30 days supply | Qty: 30 | Fill #5 | Status: AC

## 2021-05-01 ENCOUNTER — Ambulatory Visit: Payer: No Typology Code available for payment source | Admitting: Oncology

## 2021-05-01 ENCOUNTER — Other Ambulatory Visit: Payer: No Typology Code available for payment source

## 2021-05-18 ENCOUNTER — Other Ambulatory Visit: Payer: Self-pay

## 2021-05-18 MED FILL — Levothyroxine Sodium Tab 50 MCG: ORAL | 30 days supply | Qty: 30 | Fill #6 | Status: AC

## 2021-05-18 MED FILL — Trazodone HCl Tab 50 MG: ORAL | 90 days supply | Qty: 90 | Fill #1 | Status: AC

## 2021-05-19 ENCOUNTER — Other Ambulatory Visit: Payer: Self-pay

## 2021-05-20 ENCOUNTER — Other Ambulatory Visit: Payer: Self-pay

## 2021-05-20 MED ORDER — METOPROLOL SUCCINATE ER 25 MG PO TB24
ORAL_TABLET | ORAL | 0 refills | Status: DC
  Filled 2021-05-20: qty 18, 84d supply, fill #0
  Filled 2021-11-17: qty 18, 84d supply, fill #1

## 2021-05-22 ENCOUNTER — Other Ambulatory Visit: Payer: Self-pay

## 2021-05-22 MED ORDER — ZOSTER VAC RECOMB ADJUVANTED 50 MCG/0.5ML IM SUSR
INTRAMUSCULAR | 1 refills | Status: DC
  Filled 2021-05-22: qty 0.5, 1d supply, fill #0

## 2021-06-01 ENCOUNTER — Other Ambulatory Visit: Payer: Self-pay

## 2021-06-01 MED ORDER — PREDNISONE 20 MG PO TABS
ORAL_TABLET | ORAL | 0 refills | Status: DC
  Filled 2021-06-01: qty 10, 10d supply, fill #0

## 2021-06-01 MED ORDER — CELECOXIB 200 MG PO CAPS
ORAL_CAPSULE | ORAL | 2 refills | Status: DC
  Filled 2021-06-01: qty 60, 30d supply, fill #0

## 2021-06-18 MED FILL — Levothyroxine Sodium Tab 50 MCG: ORAL | 30 days supply | Qty: 30 | Fill #7 | Status: AC

## 2021-06-19 ENCOUNTER — Other Ambulatory Visit: Payer: Self-pay

## 2021-06-22 ENCOUNTER — Encounter: Payer: Self-pay | Admitting: Oncology

## 2021-06-25 ENCOUNTER — Other Ambulatory Visit: Payer: Self-pay

## 2021-06-25 ENCOUNTER — Other Ambulatory Visit: Payer: Self-pay | Admitting: *Deleted

## 2021-06-25 MED ORDER — EXEMESTANE 25 MG PO TABS
25.0000 mg | ORAL_TABLET | Freq: Every day | ORAL | 1 refills | Status: DC
  Filled 2021-06-25: qty 30, 30d supply, fill #0
  Filled 2021-08-11: qty 30, 30d supply, fill #1

## 2021-06-25 NOTE — Progress Notes (Signed)
Change to another medication

## 2021-06-29 ENCOUNTER — Other Ambulatory Visit: Payer: Self-pay

## 2021-06-29 DIAGNOSIS — C50411 Malignant neoplasm of upper-outer quadrant of right female breast: Secondary | ICD-10-CM

## 2021-06-30 ENCOUNTER — Telehealth: Payer: Self-pay | Admitting: Oncology

## 2021-06-30 NOTE — Telephone Encounter (Signed)
Anderson Malta called pt and she has itching on her breast and put vasalin and took benadryl and woke up with one breast bigger than the other and she was given appt with [facility] for tom.

## 2021-06-30 NOTE — Telephone Encounter (Signed)
Pt called and wants to know if she can get appt with Dr. Janese Banks for tomorrow. Call back is [phone]

## 2021-07-01 ENCOUNTER — Other Ambulatory Visit: Payer: Self-pay | Admitting: *Deleted

## 2021-07-01 ENCOUNTER — Telehealth: Payer: Self-pay

## 2021-07-01 ENCOUNTER — Other Ambulatory Visit: Payer: Self-pay

## 2021-07-01 ENCOUNTER — Inpatient Hospital Stay
Payer: No Typology Code available for payment source | Attending: Hospice and Palliative Medicine | Admitting: Hospice and Palliative Medicine

## 2021-07-01 VITALS — BP 134/79 | HR 70 | Temp 97.1°F | Resp 16 | Wt 135.5 lb

## 2021-07-01 DIAGNOSIS — C50911 Malignant neoplasm of unspecified site of right female breast: Secondary | ICD-10-CM | POA: Diagnosis present

## 2021-07-01 DIAGNOSIS — R21 Rash and other nonspecific skin eruption: Secondary | ICD-10-CM | POA: Insufficient documentation

## 2021-07-01 DIAGNOSIS — Z79811 Long term (current) use of aromatase inhibitors: Secondary | ICD-10-CM | POA: Insufficient documentation

## 2021-07-01 DIAGNOSIS — C50411 Malignant neoplasm of upper-outer quadrant of right female breast: Secondary | ICD-10-CM | POA: Diagnosis not present

## 2021-07-01 DIAGNOSIS — Z170 Estrogen receptor positive status [ER+]: Secondary | ICD-10-CM

## 2021-07-01 DIAGNOSIS — M25511 Pain in right shoulder: Secondary | ICD-10-CM

## 2021-07-01 DIAGNOSIS — N63 Unspecified lump in unspecified breast: Secondary | ICD-10-CM

## 2021-07-01 DIAGNOSIS — G8929 Other chronic pain: Secondary | ICD-10-CM

## 2021-07-01 DIAGNOSIS — L299 Pruritus, unspecified: Secondary | ICD-10-CM | POA: Insufficient documentation

## 2021-07-01 DIAGNOSIS — N644 Mastodynia: Secondary | ICD-10-CM

## 2021-07-01 NOTE — Progress Notes (Signed)
Symptom Management [place] at [facility] [phone] [phone]  Patient Care Team: Sheri Aus, MD as PCP - General (Internal Medicine) Sheri Guadeloupe, MD as Consulting Physician (Oncology) Sheri Pun, MD as Consulting Physician (General Surgery) Sheri Filbert, MD as Referring Physician (Radiation Oncology) Sheri Junker, RN as Registered Nurse Sheri Fruit, RN as Registered Nurse   Name of the patient: Sheri [name]  [id number]  1960/12/06   Date of visit: 07/01/21  Reason for Consult:  Sheri [name] is a 60 y.o. female with history of stage Ia ER/PR positive HER2 negative right breast cancer status postlumpectomy now on Aromasin  Patient last saw Sheri [name] for routine follow-up on 03/06/2021 at which time she had some right lower extremity pain but otherwise appeared to be doing well.  She did temporarily stop Arimidex but restarted it without difficulty.  Plan was for 54-monthfollow-up.  However, patient continued to have joint aches and was rotated to Aromasin, which she started today.  [facility] her right [facility].  Patient reports that yesterday her lumpectomy incision was itching and then became red and she noted some swelling to the right breast/axilla.  However, the redness and swelling are gone today.  Patient also says that she has had a right shoulder/axilla pain for the past 3 to 4 months.  This was evaluated by her PCP who obtained a shoulder x-ray, which reportedly did not show anything acute.  However, I am unable to locate this x-ray.  PCP reportedly thought she had bursitis and so patient was treated with prednisone taper.  This improve the pain somewhat but pain has persisted.  Patient also endorses an acneiform facial rash over the past month.  Last diagnostic mammogram was on 10/24/2020 and was without mammographic evidence of malignancy.   Recommendation was for follow-up bilateral diagnostic mammogram in 1 year.  Denies any neurologic complaints. Denies recent fevers or illnesses. Denies any easy bleeding or bruising. Reports good appetite and denies weight loss. Denies chest pain. Denies any nausea, vomiting, constipation, or diarrhea. Denies urinary complaints. Patient offers no further specific complaints today.  PAST MEDICAL HISTORY: Past Medical History:  Diagnosis Date   [place]ast cancer [place]   right breast   Dysrhythmia    tachycardia   Family history of breast cancer    Family history of prostate cancer    GERD (gastroesophageal reflux disease)    Hypertension    Personal history of radiation therapy    Vitamin D deficiency     PAST SURGICAL HISTORY:  Past Surgical History:  Procedure Laterality Date   ABDOMINAL HYSTERECTOMY     complete   BREAST BIOPSY Left 2013   negative core   BREAST BIOPSY Right 2021   INVASIVE MAMMARY CARCINOMA Grade 1. Ductal carcinoma in situ: Present, low-grade   BREAST EXCISIONAL BIOPSY Right 01/01/2020   RF tag placement [id number]   BREAST LUMPECTOMY Right 01/07/2020   CESAREAN SECTION     INCONTINENCE SURGERY     NASAL SINUS SURGERY Left 2010   PARTIAL MASTECTOMY WITH NEEDLE LOCALIZATION AND AXILLARY SENTINEL LYMPH NODE BX Right 01/07/2020   Procedure: PARTIAL MASTECTOMY WITH RADIOFREQUENCY TAG, RIGHT AXILLARY SENTINEL LYMPH NODE BIOPSY;  Surgeon: CHerbert Pun MD;  Location: [facility] ORS;  Service: General;  Laterality: Right;   TONSILLECTOMY      HEMATOLOGY/ONCOLOGY HISTORY:  Oncology History  [place] breast [place]  12/18/2019 Cancer Staging  Staging form: Breast, AJCC 8th Edition - Clinical stage from 12/18/2019: Stage IA (cT1b, cN0, cM0, G1, ER+, PR+, HER2-) - Signed by Sheri Guadeloupe, MD on 12/25/2019    12/25/2019 Initial Diagnosis   Invasive carcinoma of breast ([place])   01/18/2020 Cancer Staging   Staging form: Breast, AJCC 8th Edition -  Pathologic stage from 01/18/2020: Stage IA (pT1b, pN0, cM0, G1, ER+, PR+, HER2-) - Signed by Sheri Guadeloupe, MD on 01/18/2020     Genetic Testing   Negative genetic testing. No pathogenic variants identified on the [facility] Common Hereditary Cancers Panel. The report date is 01/22/2020.  The Common Hereditary Cancers Panel offered by [facility] includes sequencing and/or deletion duplication testing of the following 48 genes: APC, ATM, AXIN2, BARD1, BMPR1A, BRCA1, BRCA2, BRIP1, CDH1, CDKN2A (p14ARF), CDKN2A (p16INK4a), CKD4, CHEK2, CTNNA1, DICER1, EPCAM (Deletion/duplication testing only), GREM1 (promoter region deletion/duplication testing only), KIT, MEN1, MLH1, MSH2, MSH3, MSH6, MUTYH, NBN, NF1, NHTL1, PALB2, PDGFRA, PMS2, POLD1, POLE, PTEN, RAD50, RAD51C, RAD51D, RNF43, SDHB, SDHC, SDHD, SMAD4, SMARCA4. STK11, TP53, TSC1, TSC2, and VHL.  The following genes were evaluated for sequence changes only: SDHA and HOXB13 c.251G>A variant only.     ALLERGIES:  is allergic to tape.  MEDICATIONS:  Current Outpatient Medications  Medication Sig Dispense Refill   acetaminophen (TYLENOL) 500 MG tablet Take 500-1,000 mg by mouth every 6 (six) hours as needed (for pain.).     B Complex Vitamins (VITAMIN B COMPLEX PO) Take 1 tablet by mouth once a week.     calcium carbonate (TUMS - DOSED IN MG ELEMENTAL CALCIUM) 500 MG chewable tablet Chew 1-2 tablets by mouth daily as needed for indigestion or heartburn.     Calcium-Magnesium-Vitamin D (CALCIUM 1200+D3 PO) Take 1 Dose by mouth daily.     Cholecalciferol 25 MCG (1000 UT) tablet Take 1,000 Units by mouth 2 (two) times a week.      exemestane (AROMASIN) 25 MG tablet Take 1 tablet (25 mg total) by mouth daily after breakfast. 30 tablet 1   hydrochlorothiazide (MICROZIDE) 12.5 MG capsule Take 12.5 mg by mouth daily as needed (elevated blood pressure/fluid retention.).      levothyroxine (SYNTHROID) 50 MCG tablet TAKE 1 TABLET BY MOUTH ONCE DAILY TAKE ON AN EMPTY  STOMACH WITH A GLASS OF WATER AT LEAST 30-60 MINUTES BEFORE BREAKFAST. 30 tablet 11   metoprolol succinate (TOPROL-XL) 25 MG 24 hr tablet TAKE 1/2 TABLET BY MOUTH THREE TIMES A WEEK 36 tablet 0   traZODone (DESYREL) 50 MG tablet TAKE 1 TABLET BY MOUTH NIGHTLY 90 tablet 3   tretinoin (RETIN-A) 0.05 % cream APPLY PEA SIZE AMOUNT TO FACE NIGHTLY 20 g 3   Zoster Vaccine Adjuvanted [facility]) injection Inject into the muscle. (Patient not taking: Reported on 07/01/2021) 0.5 mL 1   No current facility-administered medications for this visit.    VITAL SIGNS: BP 134/79   Pulse 70   Temp (!) 97.1 F (36.2 C) (Tympanic)   Resp 16   Wt 135 lb 8 oz (61.5 kg)   BMI 24.00 kg/m  Filed Weights   07/01/21 0914  Weight: 135 lb 8 oz (61.5 kg)    Estimated body mass index is 24 kg/m as calculated from the following:   Height as of 02/04/21: 5' 3"  (1.6 m).   Weight as of this encounter: 135 lb 8 oz (61.5 kg).  LABS: CBC:    Component Value Date/Time   WBC 6.0 03/06/2021 0931   HGB 12.7 03/06/2021 0931  HCT 38.4 03/06/2021 0931   PLT 249 03/06/2021 0931   MCV 91.0 03/06/2021 0931   NEUTROABS 4.4 03/06/2021 0931   LYMPHSABS 1.0 03/06/2021 0931   MONOABS 0.4 03/06/2021 0931   EOSABS 0.2 03/06/2021 0931   BASOSABS 0.0 03/06/2021 0931   Comprehensive Metabolic Panel:    Component Value Date/Time   NA 137 03/06/2021 0931   K 4.4 03/06/2021 0931   CL 99 03/06/2021 0931   CO2 29 03/06/2021 0931   BUN 12 03/06/2021 0931   CREATININE 0.90 03/06/2021 0931   GLUCOSE 116 (H) 03/06/2021 0931   CALCIUM 9.4 03/06/2021 0931   AST 18 03/06/2021 0931   ALT 13 03/06/2021 0931   ALKPHOS 81 03/06/2021 0931   BILITOT 0.7 03/06/2021 0931   PROT 7.0 03/06/2021 0931   ALBUMIN 3.9 03/06/2021 0931    RADIOGRAPHIC STUDIES: No results found.  PERFORMANCE STATUS (ECOG) : 0 - Asymptomatic  Review of Systems Unless otherwise noted, a complete review of systems is negative.  Physical Exam General:  NAD Pulmonary: Unlabored  Breast: Lumpectomy scar noted without surrounding erythema, ecchymosis, or drainage.  No warmth or redness.  No masses or nipple inversion in right or left breast.  No adenopathy. Extremities: no edema, no joint deformities Skin: no rashes Neurological: Grossly nonfocal  Exam was chaperoned by Benjie Karvonen, RN  Assessment and Plan- Patient is a 60 y.o. female with history of stage Ia ER/PR positive HER2 negative right breast cancer status postlumpectomy now on Aromasin who presents to [facility] for evaluation of right breast swelling/pruritus   R. Breast pruritus -normal exam today and patient appears to have resolution of symptoms.  However,  discussed with Sheri [name] and will obtain ultrasound of R. breast to rule out any acute processes.  Right shoulder pain -referral to orthopedics at [facility] clinic  Facial rash -patient is established with dermatology and plans to make an appointment for follow-up.   Patient expressed understanding and was in agreement with this plan. She also understands that She can call clinic at any time with any questions, concerns, or complaints.   Thank you for allowing me to participate in the care of this very pleasant patient.   Time Total: 20 minutes  Visit consisted of counseling and education dealing with the complex and emotionally intense issues of symptom management in the setting of serious illness.Greater than 50%  of this time was spent counseling and coordinating care related to the above assessment and plan.  Signed by: Altha Harm, PhD, NP-C

## 2021-07-01 NOTE — Telephone Encounter (Signed)
New referral to [facility] orthopedics faxed 07/01/21 at 1125. Fax confirmation received.

## 2021-07-01 NOTE — Progress Notes (Signed)
Patient says that she started with her pain on her right shoulder and now she feels like it is down in her breast.  When her shoulder for started hurting she did go to see the doctor had an x-ray did not show anything.  Then the doctor gave her a prednisone taper and everything got better.  2 days ago she started feeling itchy on her right breast and then she put some Vaseline on it as well as taking a Benadryl that night. . She woke up the next day and her breast was 2 times the size of the other side.  She says its swelling is a little less today.  She now feels maybe she might have some fluid in there.

## 2021-07-08 ENCOUNTER — Other Ambulatory Visit: Payer: Self-pay

## 2021-07-08 MED ORDER — ERYTHROMYCIN 5 MG/GM OP OINT
TOPICAL_OINTMENT | OPHTHALMIC | 0 refills | Status: DC
  Filled 2021-07-08: qty 3.5, 10d supply, fill #0

## 2021-07-09 ENCOUNTER — Other Ambulatory Visit: Payer: Self-pay

## 2021-07-09 ENCOUNTER — Ambulatory Visit
Admission: RE | Admit: 2021-07-09 | Discharge: 2021-07-09 | Disposition: A | Discharge status: Home / Self Care | Payer: No Typology Code available for payment source | Source: Ambulatory Visit | Attending: Hospice and Palliative Medicine | Admitting: Hospice and Palliative Medicine

## 2021-07-09 ENCOUNTER — Telehealth: Payer: Self-pay | Admitting: Hospice and Palliative Medicine

## 2021-07-09 DIAGNOSIS — C50411 Malignant neoplasm of upper-outer quadrant of right female breast: Secondary | ICD-10-CM | POA: Insufficient documentation

## 2021-07-09 DIAGNOSIS — Z170 Estrogen receptor positive status [ER+]: Secondary | ICD-10-CM | POA: Diagnosis present

## 2021-07-09 DIAGNOSIS — N644 Mastodynia: Secondary | ICD-10-CM | POA: Diagnosis present

## 2021-07-09 DIAGNOSIS — N63 Unspecified lump in unspecified breast: Secondary | ICD-10-CM | POA: Diagnosis present

## 2021-07-09 NOTE — Telephone Encounter (Signed)
I called and spoke with patient regarding results from breast mammography/ultrasound.  Small seroma at site of lumpectomy was noted but no radiological evidence of malignancy or infection.  Patient reports her symptoms have resolved.  She agreed to call us back with any changes or concerns.

## 2021-07-10 ENCOUNTER — Other Ambulatory Visit: Payer: Self-pay

## 2021-07-10 MED ORDER — DOXYCYCLINE HYCLATE 100 MG PO CAPS
ORAL_CAPSULE | ORAL | 0 refills | Status: DC
  Filled 2021-07-10: qty 20, 10d supply, fill #0

## 2021-07-10 MED ORDER — PREDNISONE 20 MG PO TABS
ORAL_TABLET | ORAL | 0 refills | Status: DC
  Filled 2021-07-10: qty 10, 5d supply, fill #0

## 2021-07-20 ENCOUNTER — Other Ambulatory Visit: Payer: Self-pay

## 2021-07-20 MED FILL — Levothyroxine Sodium Tab 50 MCG: ORAL | 30 days supply | Qty: 30 | Fill #8 | Status: AC

## 2021-07-21 ENCOUNTER — Other Ambulatory Visit: Payer: Self-pay

## 2021-07-27 ENCOUNTER — Other Ambulatory Visit: Payer: Self-pay

## 2021-08-11 ENCOUNTER — Other Ambulatory Visit: Payer: Self-pay

## 2021-08-23 ENCOUNTER — Other Ambulatory Visit: Payer: Self-pay

## 2021-08-23 ENCOUNTER — Encounter: Payer: Self-pay | Admitting: Emergency Medicine

## 2021-08-23 ENCOUNTER — Emergency Department
Admission: EM | Admit: 2021-08-23 | Discharge: 2021-08-23 | Disposition: A | Discharge status: Home / Self Care | Payer: No Typology Code available for payment source | Attending: Emergency Medicine | Admitting: Emergency Medicine

## 2021-08-23 ENCOUNTER — Emergency Department: Payer: No Typology Code available for payment source

## 2021-08-23 DIAGNOSIS — I10 Essential (primary) hypertension: Secondary | ICD-10-CM | POA: Diagnosis not present

## 2021-08-23 DIAGNOSIS — S299XXA Unspecified injury of thorax, initial encounter: Secondary | ICD-10-CM | POA: Diagnosis present

## 2021-08-23 DIAGNOSIS — Z853 Personal history of malignant neoplasm of breast: Secondary | ICD-10-CM | POA: Diagnosis not present

## 2021-08-23 DIAGNOSIS — S20212A Contusion of left front wall of thorax, initial encounter: Secondary | ICD-10-CM | POA: Diagnosis not present

## 2021-08-23 DIAGNOSIS — W01198A Fall on same level from slipping, tripping and stumbling with subsequent striking against other object, initial encounter: Secondary | ICD-10-CM | POA: Insufficient documentation

## 2021-08-23 MED FILL — Levothyroxine Sodium Tab 50 MCG: ORAL | 30 days supply | Qty: 30 | Fill #9 | Status: AC

## 2021-08-23 NOTE — ED Notes (Signed)
Pt presents to the ED after falling and hitting her mid chest on the dining room table, pt states the pain was not bad at first but when she woke up this AM she was unable to take a deep breath. Pt is A&Ox4 and NAD.

## 2021-08-23 NOTE — Discharge Instructions (Signed)
Follow-up with your primary care provider if any continued problems or concerns.  Ice to your ribs as needed for discomfort.  Continue with Tylenol as needed for pain.  No heavy lifting, pulling or reaching above shoulder level until there is no pain.

## 2021-08-23 NOTE — ED Triage Notes (Signed)
Pt reports fell last pm and hit her left side on a table. Pt reports pain to left rib area worsening with deep breathing

## 2021-08-23 NOTE — ED Provider Notes (Signed)
[facility] Provider Note    Event Date/Time   First MD Initiated Contact with Patient 08/23/21 [phone]     (approximate)   History   Fall and Rib Injury   HPI  BRYCE KIMBLE is a 61 y.o. female presents to the ED with complaint of left rib pain after she fell last evening hitting her left side.  She has continued to have left rib pain especially with deep inspiration.  Patient has taken Tylenol with some relief while sitting in the emergency department.  Patient has a history of breast cancer, hysterectomy, vitamin D deficiency, hypertension, GERD, and early osteopenia.  She rates her pain as a 10/10.      Physical Exam   Triage Vital Signs: ED Triage Vitals  Enc Vitals Group     BP 08/23/21 0804 (!) 146/80     Pulse Rate 08/23/21 0804 74     Resp 08/23/21 0804 18     Temp 08/23/21 0804 98.1 F (36.7 C)     Temp Source 08/23/21 0804 Oral     SpO2 08/23/21 0804 99 %     Weight 08/23/21 0748 136 lb 11 oz (62 kg)     Height 08/23/21 0748 5\' 3"  (1.6 m)     Head Circumference --      Peak Flow --      Pain Score 08/23/21 0748 10     Pain Loc --      Pain Edu? --      Excl. in [place]? --     Most recent vital signs: Vitals:   08/23/21 0804  BP: (!) 146/80  Pulse: 74  Resp: 18  Temp: 98.1 F (36.7 C)  SpO2: 99%     General: Awake, no distress.  CV:  Good peripheral perfusion.  Resp:  Normal effort.  Lungs are clear bilaterally.  There is tenderness on palpation of the left lower lateral ribs without obvious deformity, soft tissue edema or discoloration. Abd:  No distention.  Other:  No abrasions or ecchymosis noted left ribs.   ED Results / Procedures / Treatments   Labs (all labs ordered are listed, but only abnormal results are displayed) Labs Reviewed - No data to display    RADIOLOGY Left ribs reviewed by myself with no obvious fracture noted.  Radiology report is negative for any acute fracture, no pneumothorax or effusion  present.    PROCEDURES:  Critical Care performed:   Procedures   MEDICATIONS ORDERED IN ED: Medications - No data to display   IMPRESSION / MDM / [place] / ED COURSE  I reviewed the triage vital signs and the nursing notes.   Differential diagnosis includes, but is not limited to, fracture left ribs, contusion left ribs, acute chest wall pain secondary to injury.  61 year old female presents to the ED with complaint of left lateral rib pain after falling and hitting a table last evening.  Patient reports this was a mechanical fall due to tripping on a table that she had placed in her living room.  Patient had difficulty sleeping last night and getting into a comfortable position.  She continued to have pain this morning and became concerned as she was told she has some osteopenia and was worried about possible rib fracture.  Left rib x-rays were reviewed by myself and were negative for acute bony injury.  Radiology report was also reviewed by myself and no acute injuries present.  Patient was reassured.  We  discussed ice to the area.  She prefers to continue taking Tylenol as needed for pain.  She works here at [employer] and states that she does not need a work note as she plans to work tomorrow if at all possible.       FINAL CLINICAL IMPRESSION(S) / ED DIAGNOSES   Final diagnoses:  Rib contusion, left, initial encounter     Rx / DC Orders   ED Discharge Orders     None        Note:  This document was prepared using Dragon voice recognition software and may include unintentional dictation errors.   Johnn Hai, PA-C 08/23/21 1136    Vanessa [place], MD 08/23/21 1228

## 2021-08-24 ENCOUNTER — Other Ambulatory Visit: Payer: Self-pay

## 2021-09-07 ENCOUNTER — Other Ambulatory Visit: Payer: Self-pay

## 2021-09-07 ENCOUNTER — Inpatient Hospital Stay: Payer: No Typology Code available for payment source | Attending: Oncology | Admitting: Oncology

## 2021-09-07 ENCOUNTER — Ambulatory Visit: Payer: No Typology Code available for payment source | Admitting: Oncology

## 2021-09-07 ENCOUNTER — Other Ambulatory Visit: Payer: No Typology Code available for payment source

## 2021-09-07 DIAGNOSIS — Z79811 Long term (current) use of aromatase inhibitors: Secondary | ICD-10-CM

## 2021-09-07 DIAGNOSIS — Z5181 Encounter for therapeutic drug level monitoring: Secondary | ICD-10-CM | POA: Diagnosis not present

## 2021-09-07 MED FILL — Trazodone HCl Tab 50 MG: ORAL | 90 days supply | Qty: 90 | Fill #2 | Status: AC

## 2021-09-10 ENCOUNTER — Other Ambulatory Visit: Payer: Self-pay

## 2021-09-10 MED ORDER — LEVOTHYROXINE SODIUM 50 MCG PO TABS
ORAL_TABLET | ORAL | 3 refills | Status: DC
  Filled 2021-09-10: qty 90, 90d supply, fill #0
  Filled 2021-12-25: qty 90, 90d supply, fill #1

## 2021-09-10 MED ORDER — TRAZODONE HCL 50 MG PO TABS
50.0000 mg | ORAL_TABLET | Freq: Every day | ORAL | 3 refills | Status: DC
  Filled 2021-09-10 – 2021-12-25 (×2): qty 90, 90d supply, fill #0
  Filled 2022-04-29: qty 90, 90d supply, fill #1
  Filled 2022-07-28: qty 90, 90d supply, fill #2

## 2021-09-12 ENCOUNTER — Encounter: Payer: Self-pay | Admitting: Oncology

## 2021-09-12 NOTE — Progress Notes (Signed)
I connected with Sheri [name] on 09/12/21 at  2:30 PM EST by video enabled telemedicine visit and verified that I am speaking with the correct person using two identifiers.   I discussed the limitations, risks, security and privacy concerns of performing an evaluation and management service by telemedicine and the availability of in-person appointments. I also discussed with the patient that there may be a patient responsible charge related to this service. The patient expressed understanding and agreed to proceed.  Other persons participating in the visit and their role in the encounter:  none  Patient's location:  home Provider's location:  work  Risk analyst Complaint: Routine follow-up of breast cancer  History of present illness: Patient is a 61 year old female with a past medical history significant for hypertension who recently underwent a screening mammogram on 10/18/2019 which showed a possible mass in the right breast.  This was followed by a diagnostic mammogram and ultrasound which showed hypoechoic mass measuring 0.8 x 0.8 x 0.9 cm at 10 o'clock position in the right breast 7 cm from the nipple.  Right axilla demonstrates normal-appearing lymph nodes.  This was biopsied and was consistent with invasive mammary carcinoma grade 1, 8 mm ER 91 -100% positive, PR 11 to 20% positive and HER-2/neu negative.    Patient is doing well for her age and denies any complaints at this time.  Menarche at the age of 26.  She used hormone replacement therapy for a few years in the past.  G2, P2 L2.  No prior breast biopsies.  Age at first pregnancy 28 years.  Menopause in her 51s.   Final pathology showed 2 separate foci of tumor 5 mm and 8 mm in size.  No malignancy in the intervening tissue.  4 sentinel lymph nodes negative for malignancy.  Margins negative.  Tumor was overall grade 1 ER/PR positive and HER-2 negative patient completed adjuvant radiation treatment and Started Arimidex in September 2021.     Interval history: Patient is tolerating exemestane well without any significant side effects as compared to Arimidex.  She is also taking her calcium and vitamin D    Review of Systems  Constitutional:  Negative for chills, fever, malaise/fatigue and weight loss.  HENT:  Negative for congestion, ear discharge and nosebleeds.   Eyes:  Negative for blurred vision.  Respiratory:  Negative for cough, hemoptysis, sputum production, shortness of breath and wheezing.   Cardiovascular:  Negative for chest pain, palpitations, orthopnea and claudication.  Gastrointestinal:  Negative for abdominal pain, blood in stool, constipation, diarrhea, heartburn, melena, nausea and vomiting.  Genitourinary:  Negative for dysuria, flank pain, frequency, hematuria and urgency.  Musculoskeletal:  Negative for back pain, joint pain and myalgias.  Skin:  Negative for rash.  Neurological:  Negative for dizziness, tingling, focal weakness, seizures, weakness and headaches.  Endo/Heme/Allergies:  Does not bruise/bleed easily.  Psychiatric/Behavioral:  Negative for depression and suicidal ideas. The patient does not have insomnia.    Allergies  Allergen Reactions   Tape Other (See Comments)    Tears skin/redness (PAPER TAPE OK and tegaderm)_    Past Medical History:  Diagnosis Date   Breast cancer ([place]) 2020   right breast   Dysrhythmia    tachycardia   Family history of breast cancer    Family history of prostate cancer    GERD (gastroesophageal reflux disease)    Hypertension    Personal history of radiation therapy    Vitamin D deficiency     Past Surgical History:  Procedure Laterality Date   ABDOMINAL HYSTERECTOMY     complete   BREAST BIOPSY Left 2013   negative core   BREAST BIOPSY Right 2021   INVASIVE MAMMARY CARCINOMA Grade 1. Ductal carcinoma in situ: Present, low-grade   BREAST EXCISIONAL BIOPSY Right 01/01/2020   RF tag placement [id number]   BREAST LUMPECTOMY Right 01/07/2020    CESAREAN SECTION     INCONTINENCE SURGERY     NASAL SINUS SURGERY Left 2010   PARTIAL MASTECTOMY WITH NEEDLE LOCALIZATION AND AXILLARY SENTINEL LYMPH NODE BX Right 01/07/2020   Procedure: PARTIAL MASTECTOMY WITH RADIOFREQUENCY TAG, RIGHT AXILLARY SENTINEL LYMPH NODE BIOPSY;  Surgeon: Herbert Pun, MD;  Location: [facility] ORS;  Service: General;  Laterality: Right;   TONSILLECTOMY      Social History   Socioeconomic History   Marital status: Divorced    Spouse name: Not on file   Number of children: Not on file   Years of education: Not on file   Highest education level: Not on file  Occupational History   Not on file  Tobacco Use   Smoking status: Former   Smokeless tobacco: Never   Tobacco comments:    33 years ago  Vaping Use   Vaping Use: Never used  Substance and Sexual Activity   Alcohol use: Not Currently   Drug use: Never   Sexual activity: Not on file  Other Topics Concern   Not on file  Social History Narrative   Not on file   Social Determinants of Health   Financial Resource Strain: Not on file  Food Insecurity: Not on file  Transportation Needs: Not on file  Physical Activity: Not on file  Stress: Not on file  Social Connections: Not on file  Intimate Partner Violence: Not on file    Family History  Problem Relation Age of Onset   Breast cancer Maternal Grandmother        dx late 60s   Prostate cancer Father        dx 20s   Prostate cancer Brother        dx 57s-50s   Cancer Paternal Grandmother        tumor on ureter dx 80s     Current Outpatient Medications:    acetaminophen (TYLENOL) 500 MG tablet, Take 500-1,000 mg by mouth every 6 (six) hours as needed (for pain.)., Disp: , Rfl:    B Complex Vitamins (VITAMIN B COMPLEX PO), Take 1 tablet by mouth once a week., Disp: , Rfl:    calcium carbonate (TUMS - DOSED IN MG ELEMENTAL CALCIUM) 500 MG chewable tablet, Chew 1-2 tablets by mouth daily as needed for indigestion or heartburn., Disp: ,  Rfl:    Calcium-Magnesium-Vitamin D (CALCIUM 1200+D3 PO), Take 1 Dose by mouth daily., Disp: , Rfl:    Cholecalciferol 25 MCG (1000 UT) tablet, Take 1,000 Units by mouth 2 (two) times a week. , Disp: , Rfl:    exemestane (AROMASIN) 25 MG tablet, Take 1 tablet (25 mg total) by mouth daily after breakfast., Disp: 30 tablet, Rfl: 1   hydrochlorothiazide (MICROZIDE) 12.5 MG capsule, Take 12.5 mg by mouth daily as needed (elevated blood pressure/fluid retention.). , Disp: , Rfl:    metoprolol succinate (TOPROL-XL) 25 MG 24 hr tablet, TAKE 1/2 TABLET BY MOUTH THREE TIMES A WEEK, Disp: 36 tablet, Rfl: 0   doxycycline (VIBRAMYCIN) 100 MG capsule, Take 1 capsule (100 mg total) by mouth 2 (two) times daily for 10 days (Patient not taking: Reported  on 09/07/2021), Disp: 20 capsule, Rfl: 0   erythromycin ophthalmic ointment, 1 CM 2 times a day until directed to stop (Patient not taking: Reported on 09/07/2021), Disp: 3.5 g, Rfl: 0   levothyroxine (SYNTHROID) 50 MCG tablet, Take 1 tablet (50 mcg total) by mouth once daily Take on an empty stomach with a glass of water at least 30-60 minutes before breakfast., Disp: 90 tablet, Rfl: 3   predniSONE (DELTASONE) 20 MG tablet, Take 1 tablet (20 mg total) by mouth 2 (two) times daily for 5 days (Patient not taking: Reported on 09/07/2021), Disp: 10 tablet, Rfl: 0   traZODone (DESYREL) 50 MG tablet, Take 1 tablet (50 mg total) by mouth at bedtime, Disp: 90 tablet, Rfl: 3   Zoster Vaccine Adjuvanted (SHINGRIX) injection, Inject into the muscle. (Patient not taking: Reported on 07/01/2021), Disp: 0.5 mL, Rfl: 1  DG Ribs Unilateral W/Chest Left  Result Date: 08/23/2021 CLINICAL DATA:  Fall with left rib pain EXAM: LEFT RIBS AND CHEST - 3+ VIEW COMPARISON:  None similar FINDINGS: No fracture or other bone lesions are seen involving the ribs. There is no evidence of pneumothorax or pleural effusion. Both lungs are clear. Heart size and mediastinal contours are within normal  limits. IMPRESSION: Negative. Electronically Signed   By: Jorje Guild M.D.   On: 08/23/2021 08:26    No images are attached to the encounter.   CMP Latest Ref Rng & Units 03/06/2021  Glucose 70 - 99 mg/dL 116(H)  BUN 6 - 20 mg/dL 12  Creatinine 0.44 - 1.00 mg/dL 0.90  Sodium 135 - 145 mmol/L 137  Potassium 3.5 - 5.1 mmol/L 4.4  Chloride 98 - 111 mmol/L 99  CO2 22 - 32 mmol/L 29  Calcium 8.9 - 10.3 mg/dL 9.4  Total Protein 6.5 - 8.1 g/dL 7.0  Total Bilirubin 0.3 - 1.2 mg/dL 0.7  Alkaline Phos 38 - 126 U/L 81  AST 15 - 41 U/L 18  ALT 0 - 44 U/L 13   CBC Latest Ref Rng & Units 03/06/2021  WBC 4.0 - 10.5 K/uL 6.0  Hemoglobin 12.0 - 15.0 g/dL 12.7  Hematocrit 36.0 - 46.0 % 38.4  Platelets 150 - 400 K/uL 249     Observation/objective: Appears in no acute distress over video visit today.  Breathing is nonlabored  Assessment and plan: Patient is a 61 year old female with history of stage I ER positive right breast cancer status postlumpectomy adjuvant radiation treatment and presently on exemestane.  This is a routine follow-up visit  Patient will continue 5 years of endocrine therapy presently on exemestane ending in September 2024.  Patient does have baseline osteopeniaBut her 10-year probability of major osteoporotic fracture is less than 20% and hip fracture is less than 3%.  She is due for a repeat bone density scan in August 2023 which we will schedule.  She also has an upcoming mammogram in April 2023.  I will see her back in 6 months to discuss bone density results as well as breast exam  Follow-up instructions: As above  I discussed the assessment and treatment plan with the patient. The patient was provided an opportunity to ask questions and all were answered. The patient agreed with the plan and demonstrated an understanding of the instructions.   The patient was advised to call back or seek an in-person evaluation if the symptoms worsen or if the condition fails to  improve as anticipated.  Visit Diagnosis: 1. Visit for monitoring Arimidex therapy  Dr. Randa Evens, MD, MPH [place] [facility] Tel- [phone] 09/12/2021 7:18 PM

## 2021-09-16 ENCOUNTER — Other Ambulatory Visit: Payer: Self-pay

## 2021-10-01 ENCOUNTER — Other Ambulatory Visit: Payer: Self-pay

## 2021-10-01 ENCOUNTER — Other Ambulatory Visit: Payer: Self-pay | Admitting: Oncology

## 2021-10-01 MED ORDER — EXEMESTANE 25 MG PO TABS
25.0000 mg | ORAL_TABLET | Freq: Every day | ORAL | 1 refills | Status: DC
  Filled 2021-10-01: qty 30, 30d supply, fill #0
  Filled 2021-11-23: qty 30, 30d supply, fill #1

## 2021-10-27 ENCOUNTER — Ambulatory Visit
Admission: RE | Admit: 2021-10-27 | Discharge: 2021-10-27 | Disposition: A | Discharge status: Home / Self Care | Payer: No Typology Code available for payment source | Source: Ambulatory Visit | Attending: Oncology | Admitting: Oncology

## 2021-10-27 DIAGNOSIS — C50411 Malignant neoplasm of upper-outer quadrant of right female breast: Secondary | ICD-10-CM | POA: Diagnosis present

## 2021-10-27 DIAGNOSIS — Z170 Estrogen receptor positive status [ER+]: Secondary | ICD-10-CM

## 2021-10-30 ENCOUNTER — Other Ambulatory Visit: Payer: Self-pay

## 2021-10-30 ENCOUNTER — Other Ambulatory Visit (HOSPITAL_COMMUNITY): Payer: Self-pay | Admitting: Oncology

## 2021-10-30 DIAGNOSIS — Z170 Estrogen receptor positive status [ER+]: Secondary | ICD-10-CM

## 2021-10-30 DIAGNOSIS — Z006 Encounter for examination for normal comparison and control in clinical research program: Secondary | ICD-10-CM

## 2021-11-17 ENCOUNTER — Other Ambulatory Visit: Payer: Self-pay

## 2021-11-23 ENCOUNTER — Other Ambulatory Visit: Payer: Self-pay

## 2021-11-25 ENCOUNTER — Encounter: Payer: Self-pay | Admitting: Gastroenterology

## 2021-11-25 ENCOUNTER — Encounter: Payer: Self-pay | Admitting: Anesthesiology

## 2021-11-26 ENCOUNTER — Other Ambulatory Visit: Payer: Self-pay

## 2021-11-27 ENCOUNTER — Telehealth: Payer: Self-pay

## 2021-11-27 NOTE — Telephone Encounter (Signed)
Patient came in today because she said she was told to come by for a prep. Patient states [facility] schedule her. 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 I informed them that we have to talk to the patient and go over question before scheduling and also have to have a referral. Patient does not have a referral. Asked patient if she is having any GI issues and she said yes she has constipation so informed patient that colonoscopy would need to be canceled and she needed a appointment first. Made appointment for 12/17/2021 and patient states she will get her provider to send the referral to [place]. Called Kim and left a message to cancel colonoscopy  ?

## 2021-12-04 ENCOUNTER — Ambulatory Visit
Admission: RE | Admit: 2021-12-04 | Payer: No Typology Code available for payment source | Source: Home / Self Care | Admitting: Gastroenterology

## 2021-12-04 HISTORY — DX: Unspecified osteoarthritis, unspecified site: M19.90

## 2021-12-04 HISTORY — DX: Hypothyroidism, unspecified: E03.9

## 2021-12-04 SURGERY — COLONOSCOPY
Anesthesia: General

## 2021-12-17 ENCOUNTER — Ambulatory Visit: Payer: No Typology Code available for payment source | Admitting: Gastroenterology

## 2021-12-25 ENCOUNTER — Other Ambulatory Visit: Payer: Self-pay

## 2021-12-28 ENCOUNTER — Other Ambulatory Visit: Payer: Self-pay

## 2021-12-29 ENCOUNTER — Other Ambulatory Visit: Payer: Self-pay

## 2021-12-31 ENCOUNTER — Other Ambulatory Visit: Payer: Self-pay

## 2022-01-06 ENCOUNTER — Other Ambulatory Visit: Payer: Self-pay

## 2022-01-06 ENCOUNTER — Ambulatory Visit (INDEPENDENT_AMBULATORY_CARE_PROVIDER_SITE_OTHER): Payer: No Typology Code available for payment source | Admitting: Gastroenterology

## 2022-01-06 ENCOUNTER — Encounter: Payer: Self-pay | Admitting: Gastroenterology

## 2022-01-06 VITALS — BP 112/72 | HR 76 | Temp 99.2°F | Ht 62.0 in | Wt 135.2 lb

## 2022-01-06 DIAGNOSIS — Z1211 Encounter for screening for malignant neoplasm of colon: Secondary | ICD-10-CM

## 2022-01-06 DIAGNOSIS — M5090 Cervical disc disorder, unspecified, unspecified cervical region: Secondary | ICD-10-CM | POA: Insufficient documentation

## 2022-01-06 DIAGNOSIS — K5904 Chronic idiopathic constipation: Secondary | ICD-10-CM | POA: Diagnosis not present

## 2022-01-06 MED ORDER — NA SULFATE-K SULFATE-MG SULF 17.5-3.13-1.6 GM/177ML PO SOLN
2.0000 | Freq: Once | ORAL | 0 refills | Status: AC
  Filled 2022-01-06 – 2022-01-14 (×2): qty 354, 1d supply, fill #0

## 2022-01-06 NOTE — Patient Instructions (Signed)
Colonoscopy has been scheduled at [facility] Tuesday 02/16/22. [facility] will call the day before to give arrival time.  2-Day Bowel Prep Recommended.  Will require 2 Suprep Kits.  The 2 day bowel prep instructions have been hand written for you. -Low Fiber Diet 07/22 -Clear Liquid Diet 07/23 and 07/24 and Begin Rx Suprep  Linzess 55mg samples provided.  Please let [place] if these are working for you and we will send a prescription.  Thank you for your visit today.

## 2022-01-06 NOTE — Progress Notes (Signed)
Cephas Darby, MD [address]  [place]  [place], [place] [address]  Main: [phone]  Fax: [phone]    Gastroenterology Consultation  Referring Provider:     Rusty Aus, MD Primary Care Physician:  Rusty Aus, MD Primary Gastroenterologist:  Dr. Cephas Darby Reason for Consultation: Chronic constipation        HPI:   Sheri [name] is a 61 y.o. female referred by Dr. Sabra Heck, Christean Grief, MD  for consultation & management of chronic constipation.  Patient reports history of intermittent irregular bowel movements for several years.  She takes Metamucil or Citrucel and stool softener on a regular basis.  Sometimes, she has a bowel movement once a week.  She is also due for her screening colonoscopy.  She does not have any other GI symptoms.  She does take daily calcium because she is on aromatase inhibitor for history of breast cancer.  She also has hypothyroidism. No known history of anemia, TSH normal Patient does not smoke or drink alcohol She denies family history of GI malignancy  NSAIDs: None  Antiplts/Anticoagulants/Anti thrombotics: None  GI Procedures: Colonoscopy 10 years ago, reportedly normal  Past Medical History:  Diagnosis Date   Arthritis    Breast cancer ([place]) 2020   right breast   Dysrhythmia    tachycardia   Family history of breast cancer    Family history of prostate cancer    GERD (gastroesophageal reflux disease)    Hypertension    Hypothyroidism    Personal history of radiation therapy    Vitamin D deficiency     Past Surgical History:  Procedure Laterality Date   ABDOMINAL HYSTERECTOMY     complete   BREAST BIOPSY Left 2013   negative core   BREAST BIOPSY Right 2021   INVASIVE MAMMARY CARCINOMA Grade 1. Ductal carcinoma in situ: Present, low-grade   BREAST EXCISIONAL BIOPSY Right 01/01/2020   RF tag placement [id number]   BREAST LUMPECTOMY Right 01/07/2020   CESAREAN SECTION     INCONTINENCE SURGERY     NASAL SINUS  SURGERY Left 2010   PARTIAL MASTECTOMY WITH NEEDLE LOCALIZATION AND AXILLARY SENTINEL LYMPH NODE BX Right 01/07/2020   Procedure: PARTIAL MASTECTOMY WITH RADIOFREQUENCY TAG, RIGHT AXILLARY SENTINEL LYMPH NODE BIOPSY;  Surgeon: Herbert Pun, MD;  Location: [facility] ORS;  Service: General;  Laterality: Right;   TONSILLECTOMY       Current Outpatient Medications:    acetaminophen (TYLENOL) 500 MG tablet, Take 500-1,000 mg by mouth every 6 (six) hours as needed (for pain.)., Disp: , Rfl:    B Complex Vitamins (VITAMIN B COMPLEX PO), Take 1 tablet by mouth once a week., Disp: , Rfl:    calcium carbonate (TUMS - DOSED IN MG ELEMENTAL CALCIUM) 500 MG chewable tablet, Chew 1-2 tablets by mouth daily as needed for indigestion or heartburn., Disp: , Rfl:    Calcium-Magnesium-Vitamin D (CALCIUM 1200+D3 PO), Take 1 Dose by mouth daily., Disp: , Rfl:    Cholecalciferol 25 MCG (1000 UT) tablet, Take 1,000 Units by mouth 2 (two) times a week. , Disp: , Rfl:    doxycycline (VIBRAMYCIN) 100 MG capsule, Take 1 capsule (100 mg total) by mouth 2 (two) times daily for 10 days, Disp: 20 capsule, Rfl: 0   erythromycin ophthalmic ointment, 1 CM 2 times a day until directed to stop, Disp: 3.5 g, Rfl: 0   exemestane (AROMASIN) 25 MG tablet, Take 1 tablet (25 mg total) by mouth daily  after breakfast., Disp: 30 tablet, Rfl: 1   hydrochlorothiazide (MICROZIDE) 12.5 MG capsule, Take 12.5 mg by mouth daily as needed (elevated blood pressure/fluid retention.). , Disp: , Rfl:    levothyroxine (SYNTHROID) 50 MCG tablet, Take by mouth., Disp: , Rfl:    metoprolol succinate (TOPROL-XL) 25 MG 24 hr tablet, TAKE 1/2 TABLET BY MOUTH THREE TIMES A WEEK, Disp: 36 tablet, Rfl: 0   Na Sulfate-K Sulfate-Mg Sulf 17.5-3.13-1.6 GM/177ML SOLN, Take 2 kits by mouth once for 1 dose., Disp: 354 mL, Rfl: 0   predniSONE (DELTASONE) 20 MG tablet, Take 1 tablet (20 mg total) by mouth 2 (two) times daily for 5 days, Disp: 10 tablet, Rfl: 0    traZODone (DESYREL) 50 MG tablet, Take 1 tablet (50 mg total) by mouth at bedtime, Disp: 90 tablet, Rfl: 3   Zoster Vaccine Adjuvanted (SHINGRIX) injection, Inject into the muscle., Disp: 0.5 mL, Rfl: 1   Family History  Problem Relation Age of Onset   Breast cancer Maternal Grandmother        dx late 25s   Prostate cancer Father        dx 73s   Prostate cancer Brother        dx 37s-50s   Cancer Paternal Grandmother        tumor on ureter dx 27s     Social History   Tobacco Use   Smoking status: Former   Smokeless tobacco: Never   Tobacco comments:    33 years ago  Vaping Use   Vaping Use: Never used  Substance Use Topics   Alcohol use: Not Currently   Drug use: Never    Allergies as of 01/06/2022 - Review Complete 01/06/2022  Allergen Reaction Noted   Tape Other (See Comments) 12/18/2019    Review of Systems:    All systems reviewed and negative except where noted in HPI.   Physical Exam:  BP 112/72 (BP Location: Left Arm, Patient Position: Sitting, Cuff Size: Normal)   Pulse 76   Temp 99.2 F (37.3 C) (Oral)   Ht '5\' 2"'$  (1.575 m)   Wt 135 lb 3.2 oz (61.3 kg)   BMI 24.73 kg/m  No LMP recorded. Patient has had a hysterectomy.  General:   Alert,  Well-developed, well-nourished, pleasant and cooperative in NAD Head:  Normocephalic and atraumatic. Eyes:  Sclera clear, no icterus.   Conjunctiva pink. Ears:  Normal auditory acuity. Nose:  No deformity, discharge, or lesions. Mouth:  No deformity or lesions,oropharynx pink & moist. Neck:  Supple; no masses or thyromegaly. Lungs:  Respirations even and unlabored.  Clear throughout to auscultation.   No wheezes, crackles, or rhonchi. No acute distress. Heart:  Regular rate and rhythm; no murmurs, clicks, rubs, or gallops. Abdomen:  Normal bowel sounds. Soft, non-tender and non-distended without masses, hepatosplenomegaly or hernias noted.  No guarding or rebound tenderness.   Rectal: Not performed Msk:  Symmetrical  without gross deformities. Good, equal movement & strength bilaterally. Pulses:  Normal pulses noted. Extremities:  No clubbing or edema.  No cyanosis. Neurologic:  Alert and oriented x3;  grossly normal neurologically. Skin:  Intact without significant lesions or rashes. No jaundice. Psych:  Alert and cooperative. Normal mood and affect.  Imaging Studies: Reviewed  Assessment and Plan:   Sheri [name] is a 61 y.o. female with history of stage I ER positive right breast cancer status postlumpectomy, adjuvant radiation therapy, currently on maintenance therapy with exemestane, history of osteopenia  Chronic constipation: Discussed  about high-fiber diet, information provided Discussed about trial of low-dose Linzess 72 mcg daily, samples provided  Schedule screening colonoscopy with 2-day prep   Follow up as needed   Cephas Darby, MD

## 2022-01-11 ENCOUNTER — Other Ambulatory Visit: Payer: Self-pay

## 2022-01-12 ENCOUNTER — Other Ambulatory Visit: Payer: Self-pay | Admitting: Oncology

## 2022-01-13 ENCOUNTER — Other Ambulatory Visit: Payer: Self-pay

## 2022-01-13 MED ORDER — EXEMESTANE 25 MG PO TABS
25.0000 mg | ORAL_TABLET | Freq: Every day | ORAL | 1 refills | Status: DC
  Filled 2022-01-13: qty 30, 30d supply, fill #0

## 2022-01-14 ENCOUNTER — Other Ambulatory Visit: Payer: Self-pay

## 2022-01-29 ENCOUNTER — Other Ambulatory Visit: Payer: Self-pay

## 2022-01-29 DIAGNOSIS — Z170 Estrogen receptor positive status [ER+]: Secondary | ICD-10-CM

## 2022-02-01 ENCOUNTER — Inpatient Hospital Stay: Payer: No Typology Code available for payment source | Admitting: Oncology

## 2022-02-01 ENCOUNTER — Other Ambulatory Visit: Payer: Self-pay

## 2022-02-01 ENCOUNTER — Inpatient Hospital Stay: Payer: No Typology Code available for payment source | Attending: Oncology

## 2022-02-01 ENCOUNTER — Ambulatory Visit (HOSPITAL_COMMUNITY)
Admission: RE | Admit: 2022-02-01 | Discharge: 2022-02-01 | Disposition: A | Discharge status: Home / Self Care | Payer: No Typology Code available for payment source | Source: Ambulatory Visit | Attending: Oncology | Admitting: Oncology

## 2022-02-01 ENCOUNTER — Encounter: Payer: Self-pay | Admitting: Oncology

## 2022-02-01 VITALS — BP 137/84 | HR 64 | Temp 97.0°F | Resp 16 | Ht 62.0 in | Wt 132.1 lb

## 2022-02-01 DIAGNOSIS — C50919 Malignant neoplasm of unspecified site of unspecified female breast: Secondary | ICD-10-CM

## 2022-02-01 DIAGNOSIS — Z170 Estrogen receptor positive status [ER+]: Secondary | ICD-10-CM

## 2022-02-01 DIAGNOSIS — Z006 Encounter for examination for normal comparison and control in clinical research program: Secondary | ICD-10-CM | POA: Insufficient documentation

## 2022-02-01 DIAGNOSIS — C50411 Malignant neoplasm of upper-outer quadrant of right female breast: Secondary | ICD-10-CM | POA: Diagnosis present

## 2022-02-01 MED ORDER — TAMOXIFEN CITRATE 20 MG PO TABS
20.0000 mg | ORAL_TABLET | Freq: Every day | ORAL | 2 refills | Status: DC
Start: 2022-02-01 — End: 2022-06-21
  Filled 2022-02-01: qty 30, 30d supply, fill #0
  Filled 2022-03-30: qty 30, 30d supply, fill #1
  Filled 2022-04-29: qty 30, 30d supply, fill #2

## 2022-02-01 NOTE — Research (Signed)
[id number] Upbeat 12 Month Visit:   Patient in to the cancer center today for her 24 month visit for the "Upbeat" protocol with Dr. Janese Banks. She confirmed that she had completed her Cardiac MRI at [facility] this morning. She confirms that she is fasting and her last meal was at 0730 am this morning. The patients required protocol labs were drawn without difficulty today x 1 attempt by Ciera in the lab. Specimen was obtained peripherally. The patient was escorted to the waiting area with the research RN. She was given her questionnaires to complete while she was waiting to see the provider. The patient was weighed with a calibrated scale set at zero without her shoes or any heavy items. Weight =132 lbs. Height was obtained without her shoes on per protocol at 62 in or 5'2". Patient was taken to the exam room and allowed to sit for approximately five minutes prior to her blood pressure being obtained. Blood pressure was obtained in her right arm, sitting with her legs straight down on the floor. Her first blood pressure was 137/84 pulse-64.Her second blood pressure was 141/83, pulse was 64.  Her oxygen saturations were 100 %. Waist circumference was measured at 35 inches per the protocol specifications. Her CV medication review form and other medication review forms were completed, as well as the cardiac evaluation form. The patient denies any cardiac events or hospitalizations since she was here last. Her medications were reviewed with the patient states she has not changed any medications except for Aromasin. She only takes this once a week now due to the arthralgias that she has. Dr. Janese Banks examined the patient today, including breast exam. She explained to the patient she is switching her to Tamoxifen to see if she can tolerate this medication. The patient was escorted to the research department to complete the protocol required testing. The six minute walk, disability measures, and expanded SPPB tests were  completed without any difficulty for the patient with the assistance of [employer], [employer].  She was able to walk 16 1/4  laps in the 6 minute walk, and hold her balance for longer than 10 seconds on all the SPPB tests today without any gait issues. She states she exercises daily now at home and in the gym.  The 24 month neurocognitive tests were administered without difficulty as well. The patient then completed her Covid-19 and [facility] Cardiomyopathy questionnaires.  The patient was given her [employer] gift card after completion of all activities today by [employer], [place].  The patient's protocol schedule was reviewed and she is aware that her next visits will be annually only beginning at year 3, and will require a phone call only with the patient to review the Cardiac information and questionnaires. The patient was thanked for her participation in the clinical trial and encouraged to call the research department if she has any questions or concerns. Approximately one and one half hour was spent with this patient to complete her research activities today. Jeral Fruit, RN 02/01/22 4:20 PM

## 2022-02-01 NOTE — Progress Notes (Signed)
Hematology/Oncology Consult note [facility]  [phone] [phone]  Patient Care Team: Rusty Aus, MD as PCP - General (Internal Medicine) Sindy Guadeloupe, MD as Consulting Physician (Oncology) Herbert Pun, MD as Consulting Physician (General Surgery) Noreene Filbert, MD as Referring Physician (Radiation Oncology) Rico Junker, RN as Registered Nurse Jeral Fruit, RN as Registered Nurse   Name of the patient: Sheri [name]  [id number]  1960-11-05   Date of visit: 02/01/22  Diagnosis- pathological prognostic stage Ia right breast cancer mpT1b pN0 cM0 ER/PR positive HER-2/neu negative s/p lumpectomy    Chief complaint/ Reason for visit-routine follow-up of breast cancer  Heme/Onc history: Patient is a 61 year old female with a past medical history significant for hypertension who recently underwent a screening mammogram on 10/18/2019 which showed a possible mass in the right breast.  This was followed by a diagnostic mammogram and ultrasound which showed hypoechoic mass measuring 0.8 x 0.8 x 0.9 cm at 10 o'clock position in the right breast 7 cm from the nipple.  Right axilla demonstrates normal-appearing lymph nodes.  This was biopsied and was consistent with invasive mammary carcinoma grade 1, 8 mm ER 91 -100% positive, PR 11 to 20% positive and HER-2/neu negative.    Patient is doing well for her age and denies any complaints at this time.  Menarche at the age of 42.  She used hormone replacement therapy for a few years in the past.  G2, P2 L2.  No prior breast biopsies.  Age at first pregnancy 28 years.  Menopause in her 41s.   Final pathology showed 2 separate foci of tumor 5 mm and 8 mm in size.  No malignancy in the intervening tissue.  4 sentinel lymph nodes negative for malignancy.  Margins negative.  Tumor was overall grade 1 ER/PR positive and HER-2 negative patient completed adjuvant radiation treatment and  Started Arimidex in September 2021.  She could not tolerate Arimidex due to arthralgias and was switched to exemestane  Interval history-patient is not tolerating exemestane well either.  She reports significant arthralgias which is impacting her quality of life.  She has tried to take 8 every 2 to 3 days and often lands up taking it just once a week  ECOG PS- 1 Pain scale- 0   Review of systems- Review of Systems  Constitutional:  Negative for chills, fever, malaise/fatigue and weight loss.  HENT:  Negative for congestion, ear discharge and nosebleeds.   Eyes:  Negative for blurred vision.  Respiratory:  Negative for cough, hemoptysis, sputum production, shortness of breath and wheezing.   Cardiovascular:  Negative for chest pain, palpitations, orthopnea and claudication.  Gastrointestinal:  Negative for abdominal pain, blood in stool, constipation, diarrhea, heartburn, melena, nausea and vomiting.  Genitourinary:  Negative for dysuria, flank pain, frequency, hematuria and urgency.  Musculoskeletal:  Positive for joint pain. Negative for back pain and myalgias.  Skin:  Negative for rash.  Neurological:  Negative for dizziness, tingling, focal weakness, seizures, weakness and headaches.  Endo/Heme/Allergies:  Does not bruise/bleed easily.  Psychiatric/Behavioral:  Negative for depression and suicidal ideas. The patient does not have insomnia.       Allergies  Allergen Reactions   Tape Other (See Comments)    Tears skin/redness (PAPER TAPE OK and tegaderm)_     Past Medical History:  Diagnosis Date   Arthritis    Breast cancer ([place]) 2020   right breast   Dysrhythmia    tachycardia  Family history of breast cancer    Family history of prostate cancer    GERD (gastroesophageal reflux disease)    Hypertension    Hypothyroidism    Personal history of radiation therapy    Vitamin D deficiency      Past Surgical History:  Procedure Laterality Date   ABDOMINAL HYSTERECTOMY      complete   BREAST BIOPSY Left 2013   negative core   BREAST BIOPSY Right 2021   INVASIVE MAMMARY CARCINOMA Grade 1. Ductal carcinoma in situ: Present, low-grade   BREAST EXCISIONAL BIOPSY Right 01/01/2020   RF tag placement [id number]   BREAST LUMPECTOMY Right 01/07/2020   CESAREAN SECTION     INCONTINENCE SURGERY     NASAL SINUS SURGERY Left 2010   PARTIAL MASTECTOMY WITH NEEDLE LOCALIZATION AND AXILLARY SENTINEL LYMPH NODE BX Right 01/07/2020   Procedure: PARTIAL MASTECTOMY WITH RADIOFREQUENCY TAG, RIGHT AXILLARY SENTINEL LYMPH NODE BIOPSY;  Surgeon: Herbert Pun, MD;  Location: [facility] ORS;  Service: General;  Laterality: Right;   TONSILLECTOMY      Social History   Socioeconomic History   Marital status: Divorced    Spouse name: Not on file   Number of children: Not on file   Years of education: Not on file   Highest education level: Not on file  Occupational History   Not on file  Tobacco Use   Smoking status: Former   Smokeless tobacco: Never   Tobacco comments:    33 years ago  Vaping Use   Vaping Use: Never used  Substance and Sexual Activity   Alcohol use: Not Currently   Drug use: Never   Sexual activity: Not on file  Other Topics Concern   Not on file  Social History Narrative   Not on file   Social Determinants of Health   Financial Resource Strain: Not on file  Food Insecurity: Not on file  Transportation Needs: Not on file  Physical Activity: Not on file  Stress: Not on file  Social Connections: Not on file  Intimate Partner Violence: Not on file    Family History  Problem Relation Age of Onset   Breast cancer Maternal Grandmother        dx late 46s   Prostate cancer Father        dx 40s   Prostate cancer Brother        dx 45s-50s   Cancer Paternal Grandmother        tumor on ureter dx 80s     Current Outpatient Medications:    acetaminophen (TYLENOL) 500 MG tablet, Take 500-1,000 mg by mouth every 6 (six) hours as needed (for  pain.)., Disp: , Rfl:    B Complex Vitamins (VITAMIN B COMPLEX PO), Take 1 tablet by mouth once a week., Disp: , Rfl:    calcium carbonate (TUMS - DOSED IN MG ELEMENTAL CALCIUM) 500 MG chewable tablet, Chew 1-2 tablets by mouth daily as needed for indigestion or heartburn., Disp: , Rfl:    Calcium-Magnesium-Vitamin D (CALCIUM 1200+D3 PO), Take 1 Dose by mouth daily., Disp: , Rfl:    Cholecalciferol 25 MCG (1000 UT) tablet, Take 1,000 Units by mouth 2 (two) times a week. , Disp: , Rfl:    exemestane (AROMASIN) 25 MG tablet, Take 1 tablet (25 mg total) by mouth daily after breakfast., Disp: 30 tablet, Rfl: 1   hydrochlorothiazide (MICROZIDE) 12.5 MG capsule, Take 12.5 mg by mouth daily as needed (elevated blood pressure/fluid retention.). , Disp: , Rfl:  levothyroxine (SYNTHROID) 50 MCG tablet, Take by mouth., Disp: , Rfl:    metoprolol succinate (TOPROL-XL) 25 MG 24 hr tablet, TAKE 1/2 TABLET BY MOUTH THREE TIMES A WEEK, Disp: 36 tablet, Rfl: 0   tamoxifen (NOLVADEX) 20 MG tablet, Take 1 tablet (20 mg total) by mouth daily., Disp: 30 tablet, Rfl: 2   traZODone (DESYREL) 50 MG tablet, Take 1 tablet (50 mg total) by mouth at bedtime, Disp: 90 tablet, Rfl: 3   Zoster Vaccine Adjuvanted [facility]) injection, Inject into the muscle., Disp: 0.5 mL, Rfl: 1  Physical exam:  Vitals:   02/01/22 1451  BP: 137/84  Pulse: 64  Resp: 16  Temp: (!) 97 F (36.1 C)  TempSrc: Tympanic  SpO2: 100%  Weight: 132 lb 1.6 oz (59.9 kg)  Height: 5' 2" (1.575 m)   Physical Exam Cardiovascular:     Rate and Rhythm: Normal rate and regular rhythm.     Heart sounds: Normal heart sounds.  Pulmonary:     Effort: Pulmonary effort is normal.     Breath sounds: Normal breath sounds.  Abdominal:     General: Bowel sounds are normal.     Palpations: Abdomen is soft.  Skin:    General: Skin is warm and dry.  Neurological:     Mental Status: She is alert and oriented to person, place, and time.     Breast exam  was performed in seated and lying down position. Patient is status post right lumpectomy with a well-healed surgical scar. No evidence of any palpable masses. No evidence of axillary adenopathy. No evidence of any palpable masses or lumps in the left breast. No evidence of leftt axillary adenopathy     Latest Ref Rng & Units 03/06/2021    9:31 AM  CMP  Glucose 70 - 99 mg/dL 116   BUN 6 - 20 mg/dL 12   Creatinine 0.44 - 1.00 mg/dL 0.90   Sodium 135 - 145 mmol/L 137   Potassium 3.5 - 5.1 mmol/L 4.4   Chloride 98 - 111 mmol/L 99   CO2 22 - 32 mmol/L 29   Calcium 8.9 - 10.3 mg/dL 9.4   Total Protein 6.5 - 8.1 g/dL 7.0   Total Bilirubin 0.3 - 1.2 mg/dL 0.7   Alkaline Phos 38 - 126 U/L 81   AST 15 - 41 U/L 18   ALT 0 - 44 U/L 13       Latest Ref Rng & Units 03/06/2021    9:31 AM  CBC  WBC 4.0 - 10.5 K/uL 6.0   Hemoglobin 12.0 - 15.0 g/dL 12.7   Hematocrit 36.0 - 46.0 % 38.4   Platelets 150 - 400 K/uL 249     No images are attached to the encounter.  No results found.   Assessment and plan- Patient is a 61 y.o. female with history of stage I right breast cancer ER/PR positive HER2 negative status postlumpectomy adjuvant radiation therapy and presently on exemestane.  This is a routine follow-up visit  Patient is not tolerating exemestane well and does report significant arthralgias.  I therefore asked her to stop taking exemestane at this time and I will switch her to tamoxifen.  Discussed risks and benefits of tamoxifen including all but not limited to possible risk of cancer and DVTs noted swings hot flashes and uterine cancer.  Patient has already undergone a hysterectomy.  She is willing to try tamoxifen at this time.  She will be due for a bone density scan  next month which is already scheduled.  I will see her back in 6 months no labs   Visit Diagnosis 1. Malignant neoplasm of upper-outer quadrant of right breast in female, estrogen receptor positive ([place])   2. Invasive  carcinoma of breast ([place])      Dr. Randa Evens, MD, MPH [facility] at [facility] [phone] 02/01/2022 4:12 PM

## 2022-02-04 ENCOUNTER — Ambulatory Visit: Payer: No Typology Code available for payment source | Admitting: Orthopedic Surgery

## 2022-02-04 ENCOUNTER — Encounter: Payer: Self-pay | Admitting: Gastroenterology

## 2022-02-09 ENCOUNTER — Encounter: Payer: Self-pay | Admitting: Hematology and Oncology

## 2022-02-15 ENCOUNTER — Other Ambulatory Visit: Payer: Self-pay

## 2022-02-15 MED ORDER — NA SULFATE-K SULFATE-MG SULF 17.5-3.13-1.6 GM/177ML PO SOLN
1.0000 | Freq: Once | ORAL | 0 refills | Status: AC
  Filled 2022-02-15: qty 354, 1d supply, fill #0

## 2022-02-15 NOTE — Progress Notes (Signed)
Patient requested additional bowel prep to do a 2 day prep.  Rx sent to pharmacy.  Thanks,  [place], [place]

## 2022-02-16 ENCOUNTER — Encounter: Payer: Self-pay | Admitting: Gastroenterology

## 2022-02-16 ENCOUNTER — Other Ambulatory Visit: Payer: Self-pay

## 2022-02-16 ENCOUNTER — Ambulatory Visit
Admission: RE | Admit: 2022-02-16 | Discharge: 2022-02-16 | Disposition: A | Discharge status: Home / Self Care | Payer: No Typology Code available for payment source | Attending: Gastroenterology | Admitting: Gastroenterology

## 2022-02-16 ENCOUNTER — Ambulatory Visit: Payer: No Typology Code available for payment source | Admitting: Anesthesiology

## 2022-02-16 ENCOUNTER — Encounter
Admission: RE | Disposition: A | Discharge status: Home / Self Care | Payer: Self-pay | Source: Home / Self Care | Attending: Gastroenterology

## 2022-02-16 DIAGNOSIS — K635 Polyp of colon: Secondary | ICD-10-CM | POA: Diagnosis not present

## 2022-02-16 DIAGNOSIS — D120 Benign neoplasm of cecum: Secondary | ICD-10-CM | POA: Insufficient documentation

## 2022-02-16 DIAGNOSIS — E039 Hypothyroidism, unspecified: Secondary | ICD-10-CM | POA: Insufficient documentation

## 2022-02-16 DIAGNOSIS — Z87891 Personal history of nicotine dependence: Secondary | ICD-10-CM | POA: Diagnosis not present

## 2022-02-16 DIAGNOSIS — I10 Essential (primary) hypertension: Secondary | ICD-10-CM | POA: Diagnosis not present

## 2022-02-16 DIAGNOSIS — Z1211 Encounter for screening for malignant neoplasm of colon: Secondary | ICD-10-CM | POA: Diagnosis present

## 2022-02-16 DIAGNOSIS — K5904 Chronic idiopathic constipation: Secondary | ICD-10-CM

## 2022-02-16 DIAGNOSIS — M1990 Unspecified osteoarthritis, unspecified site: Secondary | ICD-10-CM | POA: Diagnosis not present

## 2022-02-16 DIAGNOSIS — Z853 Personal history of malignant neoplasm of breast: Secondary | ICD-10-CM | POA: Diagnosis not present

## 2022-02-16 HISTORY — PX: COLONOSCOPY WITH PROPOFOL: SHX5780

## 2022-02-16 HISTORY — PX: POLYPECTOMY: SHX5525

## 2022-02-16 SURGERY — COLONOSCOPY WITH PROPOFOL
Anesthesia: General | Site: Rectum

## 2022-02-16 MED ORDER — ACETAMINOPHEN 160 MG/5ML PO SOLN: 325.0000 mg | ORAL | Status: DC | PRN

## 2022-02-16 MED ORDER — ONDANSETRON HCL 4 MG/2ML IJ SOLN: 4.0000 mg | Freq: Once | INTRAMUSCULAR | Status: DC | PRN

## 2022-02-16 MED ORDER — ACETAMINOPHEN 325 MG PO TABS: 325.0000 mg | ORAL_TABLET | ORAL | Status: DC | PRN

## 2022-02-16 MED ORDER — LACTATED RINGERS IV SOLN: INTRAVENOUS | Status: DC

## 2022-02-16 MED ORDER — SODIUM CHLORIDE 0.9 % IV SOLN: INTRAVENOUS | Status: DC

## 2022-02-16 MED ORDER — LIDOCAINE HCL (CARDIAC) PF 100 MG/5ML IV SOSY
PREFILLED_SYRINGE | INTRAVENOUS | Status: DC | PRN
  Administered 2022-02-16: 40 mg via INTRAVENOUS

## 2022-02-16 MED ORDER — STERILE WATER FOR IRRIGATION IR SOLN
Status: DC | PRN
  Administered 2022-02-16: 100 mL

## 2022-02-16 MED ORDER — PROPOFOL 10 MG/ML IV BOLUS
INTRAVENOUS | Status: DC | PRN
  Administered 2022-02-16: 50 mg via INTRAVENOUS
  Administered 2022-02-16: 70 mg via INTRAVENOUS
  Administered 2022-02-16: 30 mg via INTRAVENOUS

## 2022-02-16 SURGICAL SUPPLY — 26 items
CLIP HMST 235XBRD CATH ROT (MISCELLANEOUS) IMPLANT
CLIP RESOLUTION 360 11X235 (MISCELLANEOUS)
ELECT REM PT RETURN 9FT ADLT (ELECTROSURGICAL)
ELECTRODE REM PT RTRN 9FT ADLT (ELECTROSURGICAL) IMPLANT
FCP ESCP3.2XJMB 240X2.8X (MISCELLANEOUS)
FORCEPS BIOP RAD 4 LRG CAP 4 (CUTTING FORCEPS) ×1 IMPLANT
FORCEPS BIOP RJ4 240 W/NDL (MISCELLANEOUS)
FORCEPS ESCP3.2XJMB 240X2.8X (MISCELLANEOUS) IMPLANT
GOWN CVR UNV OPN BCK APRN NK (MISCELLANEOUS) ×4 IMPLANT
GOWN ISOL THUMB LOOP REG UNIV (MISCELLANEOUS) ×6
INJECTOR VARIJECT VIN23 (MISCELLANEOUS) IMPLANT
KIT DEFENDO VALVE AND CONN (KITS) IMPLANT
KIT PRC NS LF DISP ENDO (KITS) ×2 IMPLANT
KIT PROCEDURE OLYMPUS (KITS) ×3
MANIFOLD NEPTUNE II (INSTRUMENTS) ×3 IMPLANT
MARKER SPOT ENDO TATTOO 5ML (MISCELLANEOUS) IMPLANT
PROBE APC STR FIRE (PROBE) IMPLANT
RETRIEVER NET ROTH 2.5X230 LF (MISCELLANEOUS) IMPLANT
SNARE COLD EXACTO (MISCELLANEOUS) ×1 IMPLANT
SNARE SHORT THROW 13M SML OVAL (MISCELLANEOUS) IMPLANT
SNARE SHORT THROW 30M LRG OVAL (MISCELLANEOUS) IMPLANT
SNARE SNG USE RND 15MM (INSTRUMENTS) IMPLANT
SPOT EX ENDOSCOPIC TATTOO (MISCELLANEOUS)
TRAP ETRAP POLY (MISCELLANEOUS) ×1 IMPLANT
VARIJECT INJECTOR VIN23 (MISCELLANEOUS)
WATER STERILE IRR 250ML POUR (IV SOLUTION) ×3 IMPLANT

## 2022-02-16 NOTE — H&P (Signed)
Cephas Darby, MD [address]  [place]  [place],  [address]  Main: [phone]  Fax: [phone] Pager: [phone]  Primary Care Physician:  Rusty Aus, MD Primary Gastroenterologist:  Dr. Cephas Darby  Pre-Procedure History & Physical: HPI:  Sheri [name] is a 61 y.o. female is here for an colonoscopy.   Past Medical History:  Diagnosis Date   Arthritis    Breast cancer ([place]) 2020   right breast   Dysrhythmia    tachycardia   Family history of breast cancer    Family history of prostate cancer    GERD (gastroesophageal reflux disease)    Hypertension    Hypothyroidism    Personal history of radiation therapy    Vitamin D deficiency     Past Surgical History:  Procedure Laterality Date   ABDOMINAL HYSTERECTOMY     complete   BREAST BIOPSY Left 2013   negative core   BREAST BIOPSY Right 2021   INVASIVE MAMMARY CARCINOMA Grade 1. Ductal carcinoma in situ: Present, low-grade   BREAST EXCISIONAL BIOPSY Right 01/01/2020   RF tag placement [id number]   BREAST LUMPECTOMY Right 01/07/2020   CESAREAN SECTION     INCONTINENCE SURGERY     NASAL SINUS SURGERY Left 2010   PARTIAL MASTECTOMY WITH NEEDLE LOCALIZATION AND AXILLARY SENTINEL LYMPH NODE BX Right 01/07/2020   Procedure: PARTIAL MASTECTOMY WITH RADIOFREQUENCY TAG, RIGHT AXILLARY SENTINEL LYMPH NODE BIOPSY;  Surgeon: Herbert Pun, MD;  Location: [facility] ORS;  Service: General;  Laterality: Right;   TONSILLECTOMY      Prior to Admission medications   Medication Sig Start Date End Date Taking? Authorizing Provider  acetaminophen (TYLENOL) 500 MG tablet Take 500-1,000 mg by mouth every 6 (six) hours as needed (for pain.).   Yes [provider]  B Complex Vitamins (VITAMIN B COMPLEX PO) Take 1 tablet by mouth once a week.   Yes [provider]  calcium carbonate (TUMS - DOSED IN MG ELEMENTAL CALCIUM) 500 MG chewable tablet Chew 1-2 tablets by mouth daily as needed for  indigestion or heartburn.   Yes [provider]  Calcium-Magnesium-Vitamin D (CALCIUM 1200+D3 PO) Take 1 Dose by mouth daily.   Yes [provider]  Cholecalciferol 25 MCG (1000 UT) tablet Take 1,000 Units by mouth 2 (two) times a week.    Yes [provider]  hydrochlorothiazide (MICROZIDE) 12.5 MG capsule Take 12.5 mg by mouth daily as needed (elevated blood pressure/fluid retention.).  09/07/19  Yes [provider]  levothyroxine (SYNTHROID) 50 MCG tablet Take by mouth. 09/10/21 09/10/22 Yes [provider]  metoprolol succinate (TOPROL-XL) 25 MG 24 hr tablet TAKE 1/2 TABLET BY MOUTH THREE TIMES A WEEK 05/20/21  Yes   tamoxifen (NOLVADEX) 20 MG tablet Take 1 tablet (20 mg total) by mouth daily. 02/01/22  Yes Sindy Guadeloupe, MD  traZODone (DESYREL) 50 MG tablet Take 1 tablet (50 mg total) by mouth at bedtime 09/10/21  Yes   Na Sulfate-K Sulfate-Mg Sulf 17.5-3.13-1.6 GM/177ML SOLN Take 1 kit by mouth once for 1 dose. 02/15/22 02/16/22  Lin Landsman, MD  Zoster Vaccine Adjuvanted [facility]) injection Inject into the muscle. 05/22/21   Carlyle Basques, MD    Allergies as of 01/06/2022 - Review Complete 01/06/2022  Allergen Reaction Noted   Tape Other (See Comments) 12/18/2019    Family History  Problem Relation Age of Onset   Breast cancer Maternal Grandmother        dx late  38s   Prostate cancer Father        dx 75s   Prostate cancer Brother        dx 71s-50s   Cancer Paternal Grandmother        tumor on ureter dx 57s    Social History   Socioeconomic History   Marital status: Divorced    Spouse name: Not on file   Number of children: Not on file   Years of education: Not on file   Highest education level: Not on file  Occupational History   Not on file  Tobacco Use   Smoking status: Former   Smokeless tobacco: Never   Tobacco comments:    33 years ago  Vaping Use   Vaping Use: Never used  Substance and Sexual Activity    Alcohol use: Not Currently   Drug use: Never   Sexual activity: Not on file  Other Topics Concern   Not on file  Social History Narrative   Not on file   Social Determinants of Health   Financial Resource Strain: Not on file  Food Insecurity: Not on file  Transportation Needs: Not on file  Physical Activity: Not on file  Stress: Not on file  Social Connections: Not on file  Intimate Partner Violence: Not on file    Review of Systems: See HPI, otherwise negative ROS  Physical Exam: BP 127/72   Pulse 69   Temp (!) 97.5 F (36.4 C) (Temporal)   Resp 18   Ht _0  (1.575 m)   Wt 59.4 kg   SpO2 100%   BMI 23.96 kg/m  General:   Alert,  pleasant and cooperative in NAD Head:  Normocephalic and atraumatic. Neck:  Supple; no masses or thyromegaly. Lungs:  Clear throughout to auscultation.    Heart:  Regular rate and rhythm. Abdomen:  Soft, nontender and nondistended. Normal bowel sounds, without guarding, and without rebound.   Neurologic:  Alert and  oriented x4;  grossly normal neurologically.  Impression/Plan: Sheri [name] is here for an colonoscopy to be performed for colon cancer screening  Risks, benefits, limitations, and alternatives regarding  colonoscopy have been reviewed with the patient.  Questions have been answered.  All parties agreeable.   [name], MD  02/16/2022, 7:36 AM

## 2022-02-16 NOTE — Anesthesia Postprocedure Evaluation (Signed)
Anesthesia Post Note  Patient: Sheri [name]  Procedure(s) Performed: COLONOSCOPY WITH PROPOFOL (Rectum) POLYPECTOMY (Rectum)     Patient location during evaluation: PACU Anesthesia Type: General Level of consciousness: awake Pain management: pain level controlled Vital Signs Assessment: post-procedure vital signs reviewed and stable Respiratory status: respiratory function stable Cardiovascular status: stable Postop Assessment: no signs of nausea or vomiting Anesthetic complications: no   No notable events documented.  Veda Canning

## 2022-02-16 NOTE — Op Note (Signed)
[facility]enter[facility] Patient Name: Sheri [name] Procedure Date: 02/16/2022 7:22 AM MRN: [id number] Account #: [id number] Date of Birth: February 12, 1961 Admit Type: Outpatient Age: 61 Room: [facility] OR ROOM 01 Gender: Female Note Status: Finalized Instrument Name: [id number] Procedure:             Colonoscopy Indications:           Screening for colorectal malignant neoplasm, Last                         colonoscopy: January 2013 Providers:             Lin Landsman MD, MD Referring MD:          Rusty Aus, MD (Referring MD) Medicines:             General Anesthesia Complications:         No immediate complications. Estimated blood loss: None. Procedure:             Pre-Anesthesia Assessment:                        - Prior to the procedure, a History and Physical was                         performed, and patient medications and allergies were                         reviewed. The patient is competent. The risks and                         benefits of the procedure and the sedation options and                         risks were discussed with the patient. All questions                         were answered and informed consent was obtained.                         Patient identification and proposed procedure were                         verified by the physician, the nurse, the                         anesthesiologist, the anesthetist and the technician                         in the pre-procedure area in the procedure room in the                         endoscopy suite. Mental Status Examination: alert and                         oriented. Airway Examination: normal oropharyngeal                         airway and neck mobility. Respiratory Examination:  clear to auscultation. CV Examination: normal.                         Prophylactic Antibiotics: The patient does not require                         prophylactic antibiotics. Prior  Anticoagulants: The                         patient has taken no previous anticoagulant or                         antiplatelet agents. ASA Grade Assessment: II - A                         patient with mild systemic disease. After reviewing                         the risks and benefits, the patient was deemed in                         satisfactory condition to undergo the procedure. The                         anesthesia plan was to use general anesthesia.                         Immediately prior to administration of medications,                         the patient was re-assessed for adequacy to receive                         sedatives. The heart rate, respiratory rate, oxygen                         saturations, blood pressure, adequacy of pulmonary                         ventilation, and response to care were monitored                         throughout the procedure. The physical status of the                         patient was re-assessed after the procedure.                        After obtaining informed consent, the colonoscope was                         passed under direct vision. Throughout the procedure,                         the patient's blood pressure, pulse, and oxygen                         saturations were monitored continuously. The  Colonoscope was introduced through the anus and                         advanced to the the cecum, identified by appendiceal                         orifice and ileocecal valve. The colonoscopy was                         performed without difficulty. The patient tolerated                         the procedure well. The quality of the bowel                         preparation was evaluated using the BBPS [facility] Bowel                         Preparation Scale) with scores of: Right Colon = 3,                         Transverse Colon = 3 and Left Colon = 3 (entire mucosa                         seen well with no  residual staining, small fragments                         of stool or opaque liquid). The total BBPS score                         equals 9. Findings:      The perianal and digital rectal examinations were normal. Pertinent       negatives include normal sphincter tone and no palpable rectal lesions.      A 4 mm polyp was found in the cecum. The polyp was sessile. The polyp       was removed with a cold snare. Resection and retrieval were complete.       Estimated blood loss: none.      A diminutive polyp was found in the cecum. The polyp was sessile. The       polyp was removed with a cold biopsy forceps. Resection and retrieval       were complete.      The retroflexed view of the distal rectum and anal verge was normal and       showed no anal or rectal abnormalities.      The exam was otherwise without abnormality. Impression:            - One 4 mm polyp in the cecum, removed with a cold                         snare. Resected and retrieved.                        - One diminutive polyp in the cecum, removed with a                         cold biopsy forceps. Resected and  retrieved.                        - The distal rectum and anal verge are normal on                         retroflexion view.                        - The examination was otherwise normal. Recommendation:        - Discharge patient to home (with escort).                        - Resume previous diet and high fiber diet.                        - Continue present medications.                        - Await pathology results.                        - Repeat colonoscopy in 7-10 years for surveillance                         based on pathology results. Procedure Code(s):     --- Professional ---                        [id number], Colonoscopy, flexible; with removal of                         tumor(s), polyp(s), or other lesion(s) by snare                         technique                        45380, 12, Colonoscopy,  flexible; with biopsy, single                         or multiple Diagnosis Code(s):     --- Professional ---                        Z12.11, Encounter for screening for malignant neoplasm                         of colon                        K63.5, Polyp of colon CPT copyright 2019 American Medical Association. All rights reserved. The codes documented in this report are preliminary and upon coder review may  be revised to meet current compliance requirements. Dr. Ulyess Mort Lin Landsman MD, MD 02/16/2022 8:10:41 AM This report has been signed electronically. Number of Addenda: 0 Note Initiated On: 02/16/2022 7:22 AM Scope Withdrawal Time: 0 hours 15 minutes 18 seconds  Total Procedure Duration: 0 hours 20 minutes 27 seconds  Estimated Blood Loss:  Estimated blood loss: none.      [facility]

## 2022-02-16 NOTE — Transfer of Care (Signed)
Immediate Anesthesia Transfer of Care Note  Patient: Sheri [name]  Procedure(s) Performed: COLONOSCOPY WITH PROPOFOL (Rectum) POLYPECTOMY (Rectum)  Patient Location: PACU  Anesthesia Type: General  Level of Consciousness: awake, alert  and patient cooperative  Airway and Oxygen Therapy: Patient Spontanous Breathing and Patient connected to supplemental oxygen  Post-op Assessment: Post-op Vital signs reviewed, Patient's Cardiovascular Status Stable, Respiratory Function Stable, Patent Airway and No signs of Nausea or vomiting  Post-op Vital Signs: Reviewed and stable  Complications: No notable events documented.

## 2022-02-16 NOTE — Anesthesia Preprocedure Evaluation (Signed)
Anesthesia Evaluation  Patient identified by MRN, date of birth, ID band Patient awake    Reviewed: Allergy & Precautions, NPO status   Airway Mallampati: II  TM Distance: >3 FB     Dental   Pulmonary former smoker,    Pulmonary exam normal        Cardiovascular hypertension,  Rhythm:Regular Rate:Normal     Neuro/Psych    GI/Hepatic GERD  ,  Endo/Other  Hypothyroidism   Renal/GU      Musculoskeletal  (+) Arthritis ,   Abdominal   Peds  Hematology   Anesthesia Other Findings Hx breast cancer  Reproductive/Obstetrics                             Anesthesia Physical Anesthesia Plan  ASA: 2  Anesthesia Plan: General   Post-op Pain Management:    Induction: Intravenous  PONV Risk Score and Plan: Propofol infusion, TIVA and Treatment may vary due to age or medical condition  Airway Management Planned: Natural Airway and Nasal Cannula  Additional Equipment:   Intra-op Plan:   Post-operative Plan:   Informed Consent: I have reviewed the patients History and Physical, chart, labs and discussed the procedure including the risks, benefits and alternatives for the proposed anesthesia with the patient or authorized representative who has indicated his/her understanding and acceptance.       Plan Discussed with: CRNA  Anesthesia Plan Comments:         Anesthesia Quick Evaluation

## 2022-02-17 ENCOUNTER — Encounter: Payer: Self-pay | Admitting: Gastroenterology

## 2022-02-17 LAB — SURGICAL PATHOLOGY

## 2022-02-21 ENCOUNTER — Other Ambulatory Visit: Payer: Self-pay

## 2022-02-22 ENCOUNTER — Other Ambulatory Visit: Payer: Self-pay

## 2022-02-22 MED ORDER — METOPROLOL SUCCINATE ER 25 MG PO TB24
ORAL_TABLET | ORAL | 11 refills | Status: DC
  Filled 2022-02-22: qty 90, 90d supply, fill #0
  Filled 2022-04-29 – 2022-07-20 (×3): qty 90, 90d supply, fill #1

## 2022-02-23 ENCOUNTER — Telehealth: Payer: Self-pay | Admitting: Gastroenterology

## 2022-02-23 NOTE — Telephone Encounter (Signed)
Pt medical records were sent to [facility] for procedure on 02/17/2022 for colonoscopy

## 2022-03-01 ENCOUNTER — Ambulatory Visit
Admission: RE | Admit: 2022-03-01 | Discharge: 2022-03-01 | Disposition: A | Discharge status: Home / Self Care | Payer: No Typology Code available for payment source | Source: Ambulatory Visit | Attending: Oncology | Admitting: Oncology

## 2022-03-01 DIAGNOSIS — Z79811 Long term (current) use of aromatase inhibitors: Secondary | ICD-10-CM | POA: Insufficient documentation

## 2022-03-01 DIAGNOSIS — Z5181 Encounter for therapeutic drug level monitoring: Secondary | ICD-10-CM | POA: Diagnosis present

## 2022-03-09 ENCOUNTER — Encounter: Payer: Self-pay | Admitting: Oncology

## 2022-03-09 ENCOUNTER — Telehealth: Payer: Self-pay

## 2022-03-09 NOTE — Telephone Encounter (Signed)
Research nurse returned patient call this am per request. She states to Dan Europe in HIM that she is receiving bills for her Cardiac MRI that the study should pay for. Message left on secure voice mail for her to return call, and instructions to send the bill to this nurse to be able to get it to the Geophysicist/field seismologist. Will wait for correspondence from the patient regarding this matter further.  Jeral Fruit, RN 03/09/22 9:26 AM

## 2022-03-22 ENCOUNTER — Ambulatory Visit: Payer: No Typology Code available for payment source | Admitting: Oncology

## 2022-03-24 ENCOUNTER — Telehealth: Payer: Self-pay | Admitting: Gastroenterology

## 2022-03-24 NOTE — Telephone Encounter (Signed)
Pts medical records for D.O.S. were mailed out on 02/16/2022 to [facility]

## 2022-03-30 ENCOUNTER — Other Ambulatory Visit: Payer: Self-pay

## 2022-04-05 ENCOUNTER — Encounter: Payer: Self-pay | Admitting: Oncology

## 2022-04-05 ENCOUNTER — Inpatient Hospital Stay: Payer: No Typology Code available for payment source | Attending: Oncology | Admitting: Oncology

## 2022-04-05 ENCOUNTER — Other Ambulatory Visit: Payer: Self-pay

## 2022-04-05 DIAGNOSIS — Z853 Personal history of malignant neoplasm of breast: Secondary | ICD-10-CM | POA: Diagnosis not present

## 2022-04-05 DIAGNOSIS — M810 Age-related osteoporosis without current pathological fracture: Secondary | ICD-10-CM

## 2022-04-05 DIAGNOSIS — Z08 Encounter for follow-up examination after completed treatment for malignant neoplasm: Secondary | ICD-10-CM

## 2022-04-05 MED ORDER — ALENDRONATE SODIUM 70 MG PO TABS
70.0000 mg | ORAL_TABLET | ORAL | 5 refills | Status: DC
  Filled 2022-04-05: qty 4, 28d supply, fill #0
  Filled 2022-04-29: qty 4, 28d supply, fill #1
  Filled 2022-05-30: qty 4, 28d supply, fill #2
  Filled 2022-06-21: qty 4, 28d supply, fill #3
  Filled 2022-07-28: qty 4, 28d supply, fill #4
  Filled 2022-08-31: qty 4, 28d supply, fill #5

## 2022-04-05 NOTE — Progress Notes (Signed)
I connected with Sheri [name] on 04/05/22 at  3:00 PM EDT by video enabled telemedicine visit and verified that I am speaking with the correct person using two identifiers.   I discussed the limitations, risks, security and privacy concerns of performing an evaluation and management service by telemedicine and the availability of in-person appointments. I also discussed with the patient that there may be a patient responsible charge related to this service. The patient expressed understanding and agreed to proceed.  Other persons participating in the visit and their role in the encounter:  none  Patient's location:  home Provider's location:  work  Risk analyst Complaint: Discuss bone density scan results and further management  History of present illness: Patient is a 61 year old female with a past medical history significant for hypertension who recently underwent a screening mammogram on 10/18/2019 which showed a possible mass in the right breast.  This was followed by a diagnostic mammogram and ultrasound which showed hypoechoic mass measuring 0.8 x 0.8 x 0.9 cm at 10 o'clock position in the right breast 7 cm from the nipple.  Right axilla demonstrates normal-appearing lymph nodes.  This was biopsied and was consistent with invasive mammary carcinoma grade 1, 8 mm ER 91 -100% positive, PR 11 to 20% positive and HER-2/neu negative.    Patient is doing well for her age and denies any complaints at this time.  Menarche at the age of 42.  She used hormone replacement therapy for a few years in the past.  G2, P2 L2.  No prior breast biopsies.  Age at first pregnancy 28 years.  Menopause in her 49s.   Final pathology showed 2 separate foci of tumor 5 mm and 8 mm in size.  No malignancy in the intervening tissue.  4 sentinel lymph nodes negative for malignancy.  Margins negative.  Tumor was overall grade 1 ER/PR positive and HER-2 negative patient completed adjuvant radiation treatment and Started Arimidex in  September 2021.  She could not tolerate Arimidex due to arthralgias and was switched to exemestane.  Patient could not tolerate that as well and was eventually switched to tamoxifen    Interval history she is currently recovering from a bout of URI and still has some sore throat but denies other complaints   Review of Systems  Constitutional:  Negative for chills, fever, malaise/fatigue and weight loss.  HENT:  Positive for sore throat. Negative for congestion, ear discharge and nosebleeds.   Eyes:  Negative for blurred vision.  Respiratory:  Negative for cough, hemoptysis, sputum production, shortness of breath and wheezing.   Cardiovascular:  Negative for chest pain, palpitations, orthopnea and claudication.  Gastrointestinal:  Negative for abdominal pain, blood in stool, constipation, diarrhea, heartburn, melena, nausea and vomiting.  Genitourinary:  Negative for dysuria, flank pain, frequency, hematuria and urgency.  Musculoskeletal:  Negative for back pain, joint pain and myalgias.  Skin:  Negative for rash.  Neurological:  Negative for dizziness, tingling, focal weakness, seizures, weakness and headaches.  Endo/Heme/Allergies:  Does not bruise/bleed easily.  Psychiatric/Behavioral:  Negative for depression and suicidal ideas. The patient does not have insomnia.     Allergies  Allergen Reactions   Tape Other (See Comments)    Tears skin/redness (PAPER TAPE OK and tegaderm)_    Past Medical History:  Diagnosis Date   Arthritis    Breast cancer ([place]) 2020   right breast   Dysrhythmia    tachycardia   Family history of breast cancer    Family history of  prostate cancer    GERD (gastroesophageal reflux disease)    Hypertension    Hypothyroidism    Personal history of radiation therapy    Vitamin D deficiency     Past Surgical History:  Procedure Laterality Date   ABDOMINAL HYSTERECTOMY     complete   BREAST BIOPSY Left 2013   negative core   BREAST BIOPSY Right 2021    INVASIVE MAMMARY CARCINOMA Grade 1. Ductal carcinoma in situ: Present, low-grade   BREAST EXCISIONAL BIOPSY Right 01/01/2020   RF tag placement [id number]   BREAST LUMPECTOMY Right 01/07/2020   CESAREAN SECTION     COLONOSCOPY WITH PROPOFOL N/A 02/16/2022   Procedure: COLONOSCOPY WITH PROPOFOL;  Surgeon: Lin Landsman, MD;  Location: [place];  Service: Endoscopy;  Laterality: N/A;   INCONTINENCE SURGERY     NASAL SINUS SURGERY Left 2010   PARTIAL MASTECTOMY WITH NEEDLE LOCALIZATION AND AXILLARY SENTINEL LYMPH NODE BX Right 01/07/2020   Procedure: PARTIAL MASTECTOMY WITH RADIOFREQUENCY TAG, RIGHT AXILLARY SENTINEL LYMPH NODE BIOPSY;  Surgeon: Herbert Pun, MD;  Location: [facility];  Service: General;  Laterality: Right;   POLYPECTOMY  02/16/2022   Procedure: POLYPECTOMY;  Surgeon: Lin Landsman, MD;  Location: [place];  Service: Endoscopy;;   TONSILLECTOMY      Social History   Socioeconomic History   Marital status: Divorced    Spouse name: Not on file   Number of children: Not on file   Years of education: Not on file   Highest education level: Not on file  Occupational History   Not on file  Tobacco Use   Smoking status: Former   Smokeless tobacco: Never   Tobacco comments:    33 years ago  Vaping Use   Vaping Use: Never used  Substance and Sexual Activity   Alcohol use: Not Currently   Drug use: Never   Sexual activity: Not on file  Other Topics Concern   Not on file  Social History Narrative   Not on file   Social Determinants of Health   Financial Resource Strain: Not on file  Food Insecurity: Not on file  Transportation Needs: Not on file  Physical Activity: Not on file  Stress: Not on file  Social Connections: Not on file  Intimate Partner Violence: Not on file    Family History  Problem Relation Age of Onset   Breast cancer Maternal Grandmother        dx late 47s   Prostate cancer Father        dx 53s   Prostate  cancer Brother        dx 51s-50s   Cancer Paternal Grandmother        tumor on ureter dx 80s     Current Outpatient Medications:    acetaminophen (TYLENOL) 500 MG tablet, Take 500-1,000 mg by mouth every 6 (six) hours as needed (for pain.)., Disp: , Rfl:    alendronate (FOSAMAX) 70 MG tablet, Take 1 tablet (70 mg total) by mouth once a week. Take with a full glass of water on an empty stomach., Disp: 4 tablet, Rfl: 5   B Complex Vitamins (VITAMIN B COMPLEX PO), Take 1 tablet by mouth once a week., Disp: , Rfl:    calcium carbonate (TUMS - DOSED IN MG ELEMENTAL CALCIUM) 500 MG chewable tablet, Chew 1-2 tablets by mouth daily as needed for indigestion or heartburn., Disp: , Rfl:    Calcium-Magnesium-Vitamin D (CALCIUM 1200+D3 PO), Take 1 Dose by  mouth daily., Disp: , Rfl:    Cholecalciferol 25 MCG (1000 UT) tablet, Take 1,000 Units by mouth 2 (two) times a week. , Disp: , Rfl:    hydrochlorothiazide (MICROZIDE) 12.5 MG capsule, Take 12.5 mg by mouth daily as needed (elevated blood pressure/fluid retention.). , Disp: , Rfl:    levothyroxine (SYNTHROID) 50 MCG tablet, Take by mouth., Disp: , Rfl:    metoprolol succinate (TOPROL-XL) 25 MG 24 hr tablet, TAKE 1/2 TABLET BY MOUTH THREE TIMES A WEEK, Disp: 36 tablet, Rfl: 0   metoprolol succinate (TOPROL-XL) 25 MG 24 hr tablet, Take 1 tablet (25 mg total) by mouth once daily as needed, Disp: 30 tablet, Rfl: 11   tamoxifen (NOLVADEX) 20 MG tablet, Take 1 tablet (20 mg total) by mouth daily., Disp: 30 tablet, Rfl: 2   traZODone (DESYREL) 50 MG tablet, Take 1 tablet (50 mg total) by mouth at bedtime, Disp: 90 tablet, Rfl: 3   Zoster Vaccine Adjuvanted [facility]) injection, Inject into the muscle., Disp: 0.5 mL, Rfl: 1  No results found.  No images are attached to the encounter.      Latest Ref Rng & Units 03/06/2021    9:31 AM  CMP  Glucose 70 - 99 mg/dL 116   BUN 6 - 20 mg/dL 12   Creatinine 0.44 - 1.00 mg/dL 0.90   Sodium 135 - 145 mmol/L 137    Potassium 3.5 - 5.1 mmol/L 4.4   Chloride 98 - 111 mmol/L 99   CO2 22 - 32 mmol/L 29   Calcium 8.9 - 10.3 mg/dL 9.4   Total Protein 6.5 - 8.1 g/dL 7.0   Total Bilirubin 0.3 - 1.2 mg/dL 0.7   Alkaline Phos 38 - 126 U/L 81   AST 15 - 41 U/L 18   ALT 0 - 44 U/L 13       Latest Ref Rng & Units 03/06/2021    9:31 AM  CBC  WBC 4.0 - 10.5 K/uL 6.0   Hemoglobin 12.0 - 15.0 g/dL 12.7   Hematocrit 36.0 - 46.0 % 38.4   Platelets 150 - 400 K/uL 249      Observation/objective: Appears in no acute distress over video visit today.  Breathing is nonlabored  Assessment and plan: Patient is a 61 year old female with history of right breast cancer stage I ER/PR positive HER2 negative currently on tamoxifen here to discuss bone density results and further management  Discussed results of bone densityScan that were done in August 2023 which does show worsening osteoporosis in her right hip and her score has worsened from -2.4 in 2021 to -2.5 presently.  T-scores are also overall worse in her AP spine as compared to 2 years ago.  Given that she has overt osteoporosis I would recommend either oral or parenteral bisphosphonates for her.  Oral bisphosphonate would be Fosamax given weekly at 70 mg.  Discussed risks and benefits of Fosamax including all but not limited to possible symptoms of GERD.  Discussed risks and benefits of Reclast including all but not limited to hypocalcemia and risk of osteonecrosis of the jaw.  Patient understands pros and cons of both and wishes to proceed with Fosamax at this time and I will send with a prescription for the same.  With regards to breast cancer she is currently on tamoxifen which would not worsen her existing osteoporosis.  She will continue to stay on that.  I will see her in January as planned  Follow-up instructions: As above  I  discussed the assessment and treatment plan with the patient. The patient was provided an opportunity to ask questions and all were  answered. The patient agreed with the plan and demonstrated an understanding of the instructions.   The patient was advised to call back or seek an in-person evaluation if the symptoms worsen or if the condition fails to improve as anticipated.  I provided 20 minutes of face-to-face video visit time during this encounter, and > 50% was spent counseling as documented under my assessment & plan.  Visit Diagnosis: 1. Osteoporosis without current pathological fracture, unspecified osteoporosis type   2. Encounter for follow-up surveillance of breast cancer     Dr. Randa Evens, MD, MPH [facility] Tel- [phone] 04/05/2022 3:47 PM

## 2022-04-29 ENCOUNTER — Other Ambulatory Visit: Payer: Self-pay

## 2022-05-05 NOTE — Telephone Encounter (Signed)
ERROR

## 2022-05-19 IMAGING — MG DIGITAL DIAGNOSTIC BILAT W/ TOMO W/ CAD
6 of 11 series · 6 of 31 positions shown · non-contrast
Comparison: Previous exam(s).

CLINICAL DATA: History of treated right breast cancer, status post
breast conservation therapy in December 2019.

EXAM:
DIGITAL DIAGNOSTIC BILATERAL MAMMOGRAM WITH TOMOSYNTHESIS AND CAD
TECHNIQUE: Bilateral digital diagnostic mammography and breast tomosynthesis
was performed. The images were evaluated with computer-aided
detection.

[R MLO]
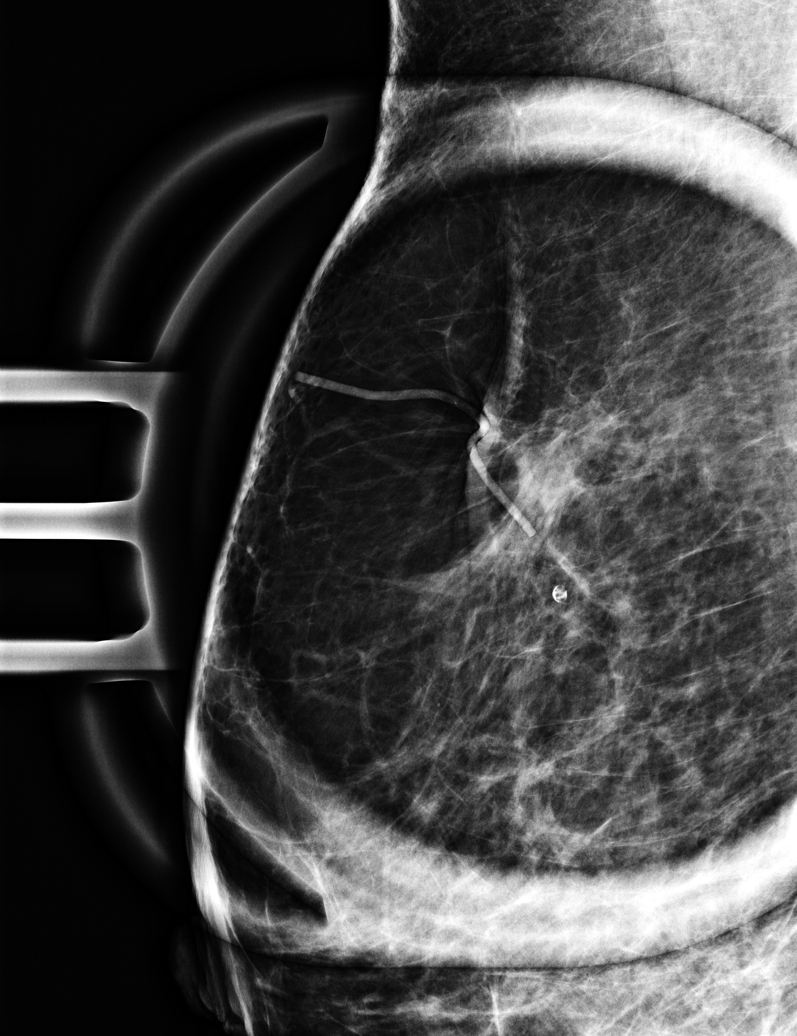

[R CC synth-2D]
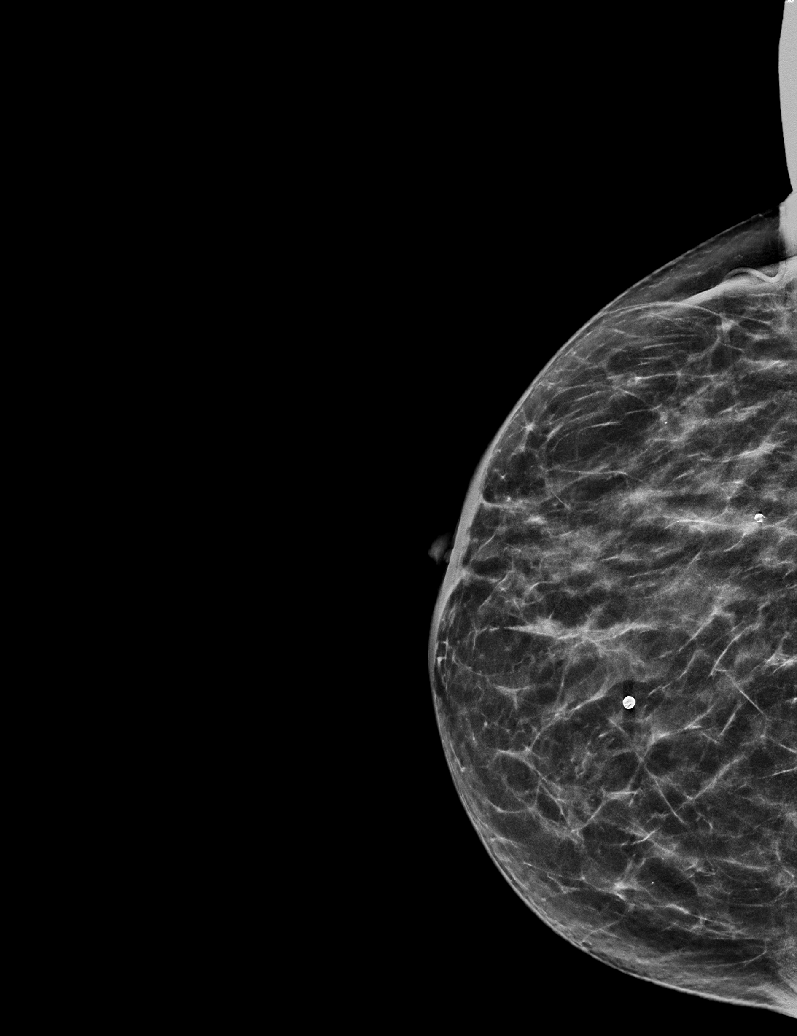

[R MLO synth-2D]
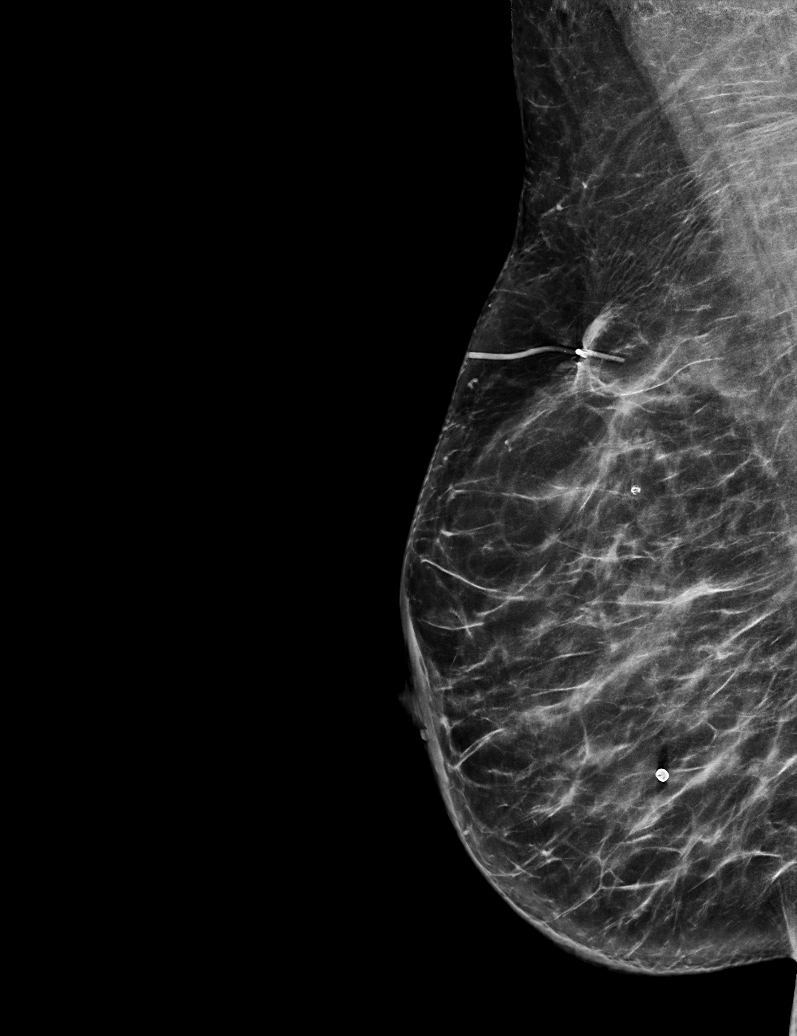

[L MLO synth-2D]
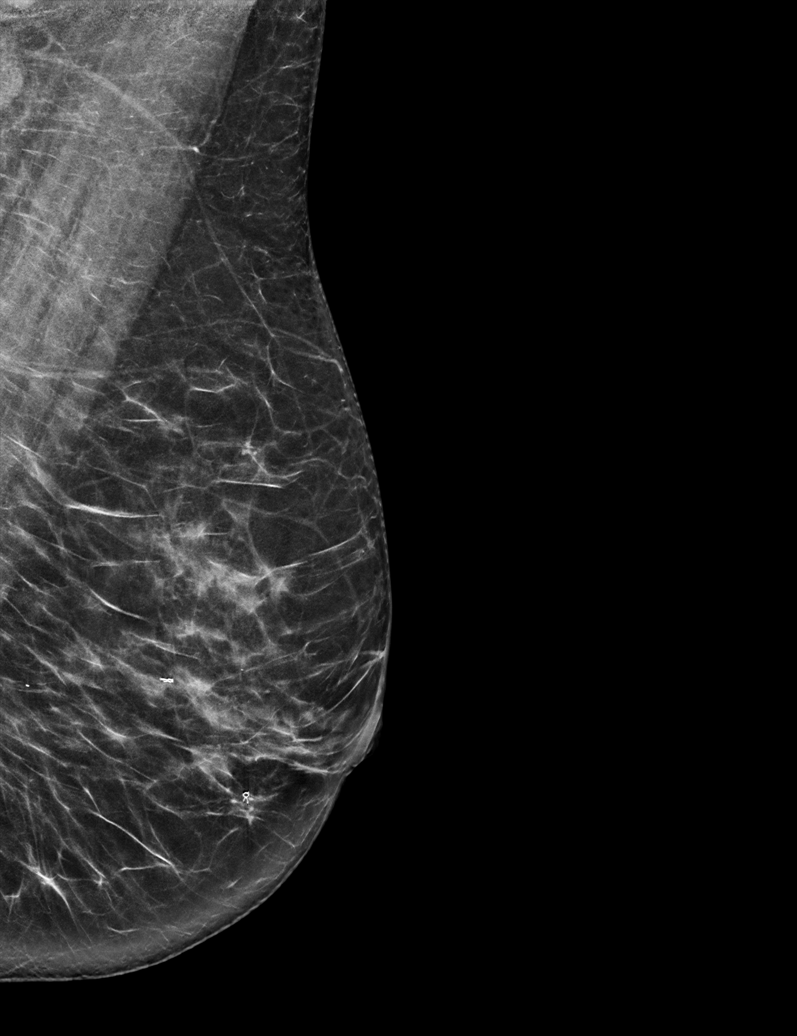

[L CC synth-2D]
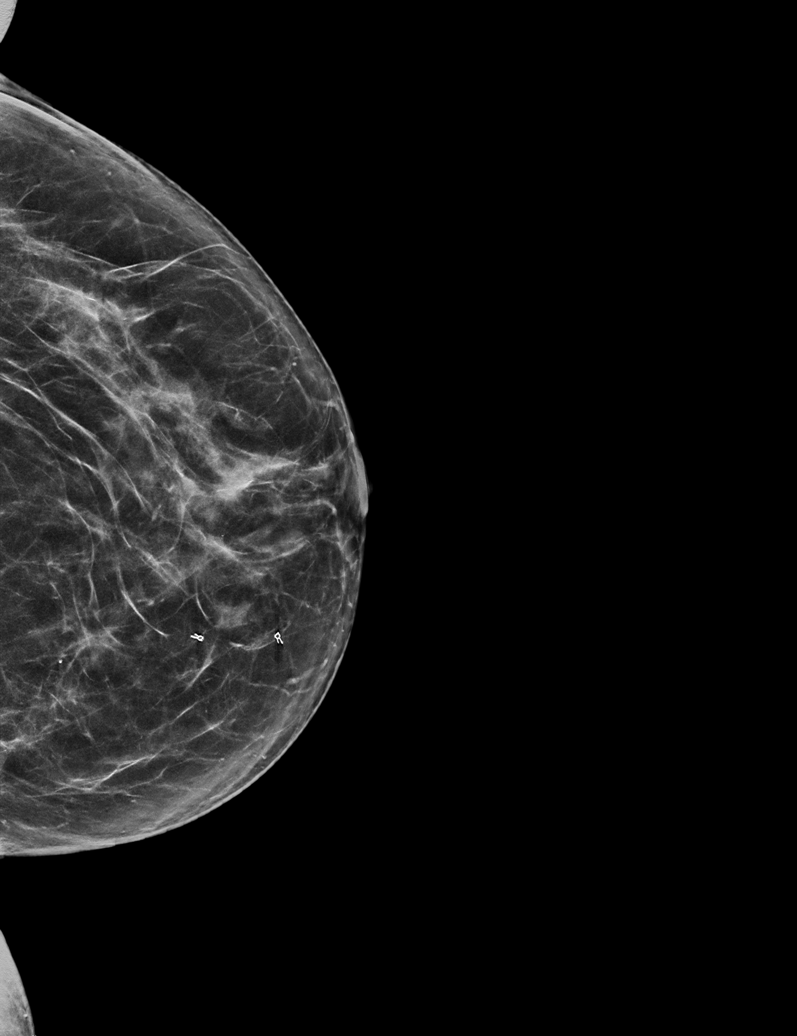

[R XCCL synth-2D]
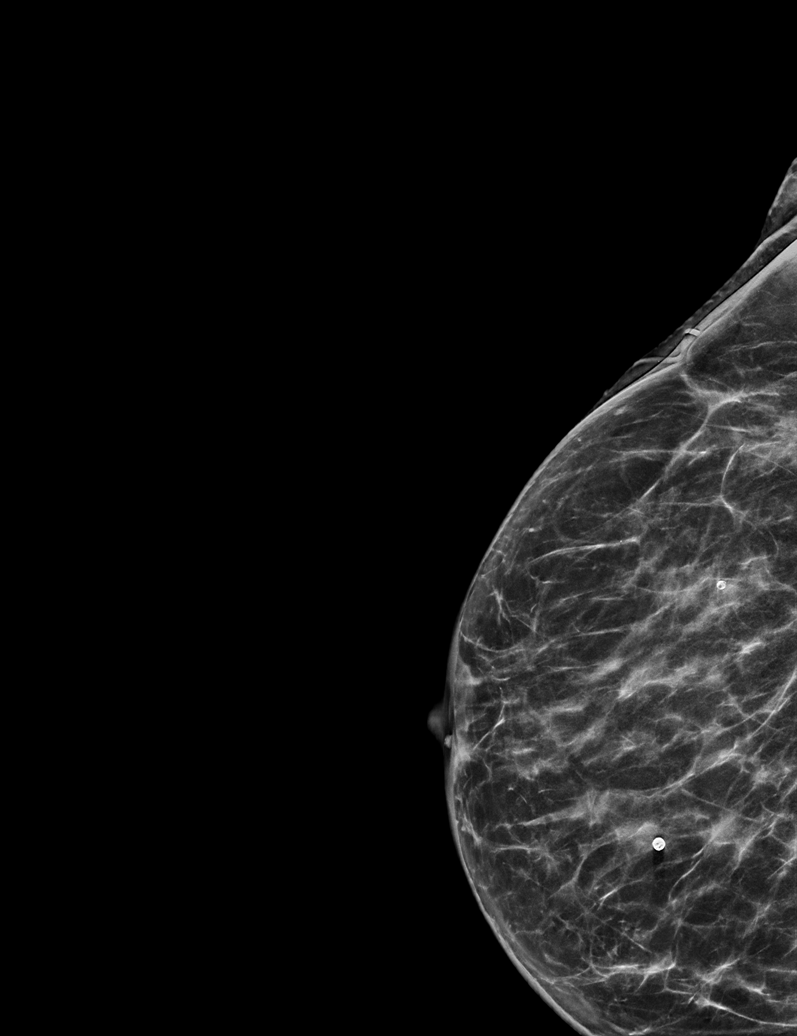

[6 of 31 positions shown; findings below may reference images not displayed]

ACR Breast Density Category b: There are scattered areas of
fibroglandular density.
FINDINGS: Mammographically, there are no suspicious masses, areas of
nonsurgical architectural distortion or microcalcifications in
either breast. Stable posttreatment changes in the right breast.
IMPRESSION: No mammographic evidence of malignancy in either breast, status post
right lumpectomy.

RECOMMENDATION:
Screening mammogram in one year.(Code:[id number])

I have discussed the findings and recommendations with the patient.
If applicable, a reminder letter will be sent to the patient
regarding the next appointment.

BI-RADS CATEGORY  2: Benign.

## 2022-05-30 ENCOUNTER — Other Ambulatory Visit: Payer: Self-pay | Admitting: *Deleted

## 2022-05-30 DIAGNOSIS — C50411 Malignant neoplasm of upper-outer quadrant of right female breast: Secondary | ICD-10-CM

## 2022-05-31 ENCOUNTER — Other Ambulatory Visit: Payer: Self-pay

## 2022-06-21 ENCOUNTER — Other Ambulatory Visit: Payer: Self-pay

## 2022-06-21 ENCOUNTER — Other Ambulatory Visit: Payer: Self-pay | Admitting: Oncology

## 2022-06-21 MED ORDER — TAMOXIFEN CITRATE 20 MG PO TABS
20.0000 mg | ORAL_TABLET | Freq: Every day | ORAL | 3 refills | Status: DC
  Filled 2022-06-21: qty 30, 30d supply, fill #0
  Filled 2022-07-17 – 2022-07-20 (×2): qty 30, 30d supply, fill #1
  Filled 2022-08-31: qty 30, 30d supply, fill #2
  Filled 2022-11-09: qty 30, 30d supply, fill #3

## 2022-06-22 ENCOUNTER — Other Ambulatory Visit: Payer: Self-pay

## 2022-07-20 ENCOUNTER — Other Ambulatory Visit: Payer: Self-pay

## 2022-07-29 ENCOUNTER — Other Ambulatory Visit: Payer: Self-pay

## 2022-08-02 ENCOUNTER — Other Ambulatory Visit: Payer: Self-pay

## 2022-08-02 ENCOUNTER — Other Ambulatory Visit: Payer: Self-pay | Admitting: Nurse Practitioner

## 2022-08-02 DIAGNOSIS — N3000 Acute cystitis without hematuria: Secondary | ICD-10-CM

## 2022-08-02 DIAGNOSIS — C801 Malignant (primary) neoplasm, unspecified: Secondary | ICD-10-CM | POA: Insufficient documentation

## 2022-08-02 MED ORDER — CEPHALEXIN 500 MG PO CAPS
500.0000 mg | ORAL_CAPSULE | Freq: Two times a day (BID) | ORAL | 0 refills | Status: AC
  Filled 2022-08-02: qty 14, 7d supply, fill #0

## 2022-08-02 NOTE — Progress Notes (Signed)
Rx transfer from [facility]

## 2022-08-04 ENCOUNTER — Inpatient Hospital Stay: Payer: 59 | Attending: Oncology | Admitting: Oncology

## 2022-08-04 DIAGNOSIS — Z79810 Long term (current) use of selective estrogen receptor modulators (SERMs): Secondary | ICD-10-CM | POA: Insufficient documentation

## 2022-08-04 DIAGNOSIS — C50411 Malignant neoplasm of upper-outer quadrant of right female breast: Secondary | ICD-10-CM | POA: Insufficient documentation

## 2022-08-04 DIAGNOSIS — Z170 Estrogen receptor positive status [ER+]: Secondary | ICD-10-CM | POA: Insufficient documentation

## 2022-08-25 ENCOUNTER — Encounter: Payer: Self-pay | Admitting: Oncology

## 2022-08-25 ENCOUNTER — Inpatient Hospital Stay: Payer: 59 | Admitting: Oncology

## 2022-08-25 VITALS — BP 108/67 | HR 65 | Resp 18 | Ht 62.0 in | Wt 134.0 lb

## 2022-08-25 DIAGNOSIS — Z7983 Long term (current) use of bisphosphonates: Secondary | ICD-10-CM | POA: Diagnosis not present

## 2022-08-25 DIAGNOSIS — Z08 Encounter for follow-up examination after completed treatment for malignant neoplasm: Secondary | ICD-10-CM | POA: Diagnosis not present

## 2022-08-25 DIAGNOSIS — Z853 Personal history of malignant neoplasm of breast: Secondary | ICD-10-CM | POA: Diagnosis not present

## 2022-08-25 DIAGNOSIS — C50411 Malignant neoplasm of upper-outer quadrant of right female breast: Secondary | ICD-10-CM | POA: Diagnosis not present

## 2022-08-25 DIAGNOSIS — Z170 Estrogen receptor positive status [ER+]: Secondary | ICD-10-CM | POA: Diagnosis not present

## 2022-08-25 DIAGNOSIS — Z5181 Encounter for therapeutic drug level monitoring: Secondary | ICD-10-CM

## 2022-08-25 DIAGNOSIS — Z79810 Long term (current) use of selective estrogen receptor modulators (SERMs): Secondary | ICD-10-CM

## 2022-08-25 NOTE — Progress Notes (Signed)
Patient reports an ache under right breast area after exercising

## 2022-08-26 NOTE — Progress Notes (Signed)
Hematology/Oncology Consult note [facility]  [phone] [phone]  Patient Care Team: Rusty Aus, MD as PCP - General (Internal Medicine) Sindy Guadeloupe, MD as Consulting Physician (Oncology) Herbert Pun, MD as Consulting Physician (General Surgery) Noreene Filbert, MD as Referring Physician (Radiation Oncology) Rico Junker, RN as Registered Nurse Jeral Fruit, RN as Registered Nurse   Name of the patient: Sheri [name]  [id number]  1960-10-18   Date of visit: 08/26/22  Diagnosis- h/o stage 1 breast cancer  Chief complaint/ Reason for visit- routine f/u of breast cancer on tamoxifen  Heme/Onc history:  Patient is a 62 year old female with a past medical history significant for hypertension who recently underwent a screening mammogram on 10/18/2019 which showed a possible mass in the right breast.  This was followed by a diagnostic mammogram and ultrasound which showed hypoechoic mass measuring 0.8 x 0.8 x 0.9 cm at 10 o'clock position in the right breast 7 cm from the nipple.  Right axilla demonstrates normal-appearing lymph nodes.  This was biopsied and was consistent with invasive mammary carcinoma grade 1, 8 mm ER 91 -100% positive, PR 11 to 20% positive and HER-2/neu negative.    Patient is doing well for her age and denies any complaints at this time.  Menarche at the age of 62.  She used hormone replacement therapy for a few years in the past.  G2, P2 L2.  No prior breast biopsies.  Age at first pregnancy 28 years.  Menopause in her 35s.   Final pathology showed 2 separate foci of tumor 5 mm and 8 mm in size.  No malignancy in the intervening tissue.  4 sentinel lymph nodes negative for malignancy.  Margins negative.  Tumor was overall grade 1 ER/PR positive and HER-2 negative patient completed adjuvant radiation treatment and Started Arimidex in September 2021.  She could not tolerate Arimidex due to arthralgias  and was switched to exemestane.  Patient could not tolerate that as well and was eventually switched to tamoxifen    Interval history- tolerating tamoxifen well without any significant side effects. She is also compliant with fosamax and has no reflux issues.   ECOG PS- 0 Pain scale- 0   Review of systems- Review of Systems  Constitutional:  Negative for chills, fever, malaise/fatigue and weight loss.  HENT:  Negative for congestion, ear discharge and nosebleeds.   Eyes:  Negative for blurred vision.  Respiratory:  Negative for cough, hemoptysis, sputum production, shortness of breath and wheezing.   Cardiovascular:  Negative for chest pain, palpitations, orthopnea and claudication.  Gastrointestinal:  Negative for abdominal pain, blood in stool, constipation, diarrhea, heartburn, melena, nausea and vomiting.  Genitourinary:  Negative for dysuria, flank pain, frequency, hematuria and urgency.  Musculoskeletal:  Negative for back pain, joint pain and myalgias.  Skin:  Negative for rash.  Neurological:  Negative for dizziness, tingling, focal weakness, seizures, weakness and headaches.  Endo/Heme/Allergies:  Does not bruise/bleed easily.  Psychiatric/Behavioral:  Negative for depression and suicidal ideas. The patient does not have insomnia.       Allergies  Allergen Reactions   Tape Other (See Comments)    Tears skin/redness (PAPER TAPE OK and tegaderm)_     Past Medical History:  Diagnosis Date   Arthritis    Breast cancer ([place]) 2020   right breast   Dysrhythmia    tachycardia   Family history of breast cancer    Family history of prostate cancer  GERD (gastroesophageal reflux disease)    Hypertension    Hypothyroidism    Personal history of radiation therapy    Vitamin D deficiency      Past Surgical History:  Procedure Laterality Date   ABDOMINAL HYSTERECTOMY     complete   BREAST BIOPSY Left 2013   negative core   BREAST BIOPSY Right 2021   INVASIVE  MAMMARY CARCINOMA Grade 1. Ductal carcinoma in situ: Present, low-grade   BREAST EXCISIONAL BIOPSY Right 01/01/2020   RF tag placement [id number]   BREAST LUMPECTOMY Right 01/07/2020   CESAREAN SECTION     COLONOSCOPY WITH PROPOFOL N/A 02/16/2022   Procedure: COLONOSCOPY WITH PROPOFOL;  Surgeon: Lin Landsman, MD;  Location: [place];  Service: Endoscopy;  Laterality: N/A;   INCONTINENCE SURGERY     NASAL SINUS SURGERY Left 2010   PARTIAL MASTECTOMY WITH NEEDLE LOCALIZATION AND AXILLARY SENTINEL LYMPH NODE BX Right 01/07/2020   Procedure: PARTIAL MASTECTOMY WITH RADIOFREQUENCY TAG, RIGHT AXILLARY SENTINEL LYMPH NODE BIOPSY;  Surgeon: Herbert Pun, MD;  Location: [facility];  Service: General;  Laterality: Right;   POLYPECTOMY  02/16/2022   Procedure: POLYPECTOMY;  Surgeon: Lin Landsman, MD;  Location: [place];  Service: Endoscopy;;   TONSILLECTOMY      Social History   Socioeconomic History   Marital status: Divorced    Spouse name: Not on file   Number of children: Not on file   Years of education: Not on file   Highest education level: Not on file  Occupational History   Not on file  Tobacco Use   Smoking status: Former   Smokeless tobacco: Never   Tobacco comments:    33 years ago  Vaping Use   Vaping Use: Never used  Substance and Sexual Activity   Alcohol use: Not Currently   Drug use: Never   Sexual activity: Not on file  Other Topics Concern   Not on file  Social History Narrative   Not on file   Social Determinants of Health   Financial Resource Strain: Not on file  Food Insecurity: Not on file  Transportation Needs: Not on file  Physical Activity: Not on file  Stress: Not on file  Social Connections: Not on file  Intimate Partner Violence: Not on file    Family History  Problem Relation Age of Onset   Breast cancer Maternal Grandmother        dx late 42s   Prostate cancer Father        dx 66s   Prostate cancer  Brother        dx 9s-50s   Cancer Paternal Grandmother        tumor on ureter dx 80s     Current Outpatient Medications:    acetaminophen (TYLENOL) 500 MG tablet, Take 500-1,000 mg by mouth every 6 (six) hours as needed (for pain.)., Disp: , Rfl:    alendronate (FOSAMAX) 70 MG tablet, Take 1 tablet (70 mg total) by mouth once a week. Take with a full glass of water on an empty stomach., Disp: 4 tablet, Rfl: 5   B Complex Vitamins (VITAMIN B COMPLEX PO), Take 1 tablet by mouth once a week., Disp: , Rfl:    calcium carbonate (TUMS - DOSED IN MG ELEMENTAL CALCIUM) 500 MG chewable tablet, Chew 1-2 tablets by mouth daily as needed for indigestion or heartburn., Disp: , Rfl:    Calcium-Magnesium-Vitamin D (CALCIUM 1200+D3 PO), Take 1 Dose by mouth daily., Disp: ,  Rfl:    Cholecalciferol 25 MCG (1000 UT) tablet, Take 1,000 Units by mouth 2 (two) times a week. , Disp: , Rfl:    hydrochlorothiazide (MICROZIDE) 12.5 MG capsule, Take 12.5 mg by mouth daily as needed (elevated blood pressure/fluid retention.). , Disp: , Rfl:    levothyroxine (SYNTHROID) 50 MCG tablet, Take by mouth., Disp: , Rfl:    metoprolol succinate (TOPROL-XL) 25 MG 24 hr tablet, TAKE 1/2 TABLET BY MOUTH THREE TIMES A WEEK, Disp: 36 tablet, Rfl: 0   metoprolol succinate (TOPROL-XL) 25 MG 24 hr tablet, Take 1 tablet (25 mg total) by mouth once daily as needed, Disp: 30 tablet, Rfl: 11   tamoxifen (NOLVADEX) 20 MG tablet, Take 1 tablet (20 mg total) by mouth daily., Disp: 30 tablet, Rfl: 3   traZODone (DESYREL) 50 MG tablet, Take 1 tablet (50 mg total) by mouth at bedtime, Disp: 90 tablet, Rfl: 3   Zoster Vaccine Adjuvanted [facility]) injection, Inject into the muscle., Disp: 0.5 mL, Rfl: 1  Physical exam:  Vitals:   08/25/22 1527  BP: 108/67  Pulse: 65  Resp: 18  SpO2: 98%  Weight: 134 lb (60.8 kg)  Height: '5\' 2"'$  (1.575 m)   Physical Exam Cardiovascular:     Rate and Rhythm: Normal rate and regular rhythm.     Heart  sounds: Normal heart sounds.  Pulmonary:     Effort: Pulmonary effort is normal.     Breath sounds: Normal breath sounds.  Musculoskeletal:     Cervical back: Normal range of motion.  Skin:    General: Skin is warm and dry.  Neurological:     Mental Status: She is alert and oriented to person, place, and time.    Breast exam was performed in seated and lying down position. Patient is status post right lumpectomy with a well-healed surgical scar. No evidence of any palpable masses. No evidence of axillary adenopathy. No evidence of any palpable masses or lumps in the left breast. No evidence of leftt axillary adenopathy      Latest Ref Rng & Units 03/06/2021    9:31 AM  CMP  Glucose 70 - 99 mg/dL 116   BUN 6 - 20 mg/dL 12   Creatinine 0.44 - 1.00 mg/dL 0.90   Sodium 135 - 145 mmol/L 137   Potassium 3.5 - 5.1 mmol/L 4.4   Chloride 98 - 111 mmol/L 99   CO2 22 - 32 mmol/L 29   Calcium 8.9 - 10.3 mg/dL 9.4   Total Protein 6.5 - 8.1 g/dL 7.0   Total Bilirubin 0.3 - 1.2 mg/dL 0.7   Alkaline Phos 38 - 126 U/L 81   AST 15 - 41 U/L 18   ALT 0 - 44 U/L 13       Latest Ref Rng & Units 03/06/2021    9:31 AM  CBC  WBC 4.0 - 10.5 K/uL 6.0   Hemoglobin 12.0 - 15.0 g/dL 12.7   Hematocrit 36.0 - 46.0 % 38.4   Platelets 150 - 400 K/uL 249     No images are attached to the encounter.  No results found.   Assessment and plan- Patient is a 62 y.o. female with history of right breast cancer stage I ER/PR positive HER2 negative. She is currently on tamoxifen and this is a routine f/u visit  Clinically patient is tolerating tamoxifen well without significant side effects. She is s/p hysterectomy. I have encouraged her to go for yearly eye exams given risk of cataracts.  She is scheduled for mammogram in April 2024.  Patient has baseline osteoporosis with a T-score of -2.5 in the right femur neck.  She is on weekly Fosamax which she will continue.   Visit Diagnosis 1. Encounter for  monitoring tamoxifen therapy   2. H/O ongoing treatment with alendronate (Fosamax)   3. Encounter for follow-up surveillance of breast cancer      Dr. Randa Evens, MD, MPH [facility] [phone] 08/26/2022 1:08 PM

## 2022-09-15 DIAGNOSIS — Z0000 Encounter for general adult medical examination without abnormal findings: Secondary | ICD-10-CM | POA: Diagnosis not present

## 2022-09-22 ENCOUNTER — Other Ambulatory Visit: Payer: Self-pay

## 2022-09-22 ENCOUNTER — Other Ambulatory Visit: Payer: Self-pay | Admitting: Internal Medicine

## 2022-09-22 DIAGNOSIS — C50511 Malignant neoplasm of lower-outer quadrant of right female breast: Secondary | ICD-10-CM | POA: Diagnosis not present

## 2022-09-22 DIAGNOSIS — Z170 Estrogen receptor positive status [ER+]: Secondary | ICD-10-CM | POA: Diagnosis not present

## 2022-09-22 DIAGNOSIS — Z0000 Encounter for general adult medical examination without abnormal findings: Secondary | ICD-10-CM | POA: Diagnosis not present

## 2022-09-22 DIAGNOSIS — R1011 Right upper quadrant pain: Secondary | ICD-10-CM | POA: Diagnosis not present

## 2022-09-22 MED ORDER — METOPROLOL SUCCINATE ER 25 MG PO TB24
25.0000 mg | ORAL_TABLET | Freq: Every day | ORAL | 3 refills | Status: DC | PRN
  Filled 2022-09-22: qty 90, 90d supply, fill #0

## 2022-09-22 MED ORDER — TRAZODONE HCL 50 MG PO TABS
50.0000 mg | ORAL_TABLET | Freq: Every day | ORAL | 3 refills | Status: DC
  Filled 2022-09-22 – 2022-12-11 (×2): qty 90, 90d supply, fill #0
  Filled 2023-08-01: qty 90, 90d supply, fill #1

## 2022-09-24 ENCOUNTER — Ambulatory Visit
Admission: RE | Admit: 2022-09-24 | Discharge: 2022-09-24 | Disposition: A | Discharge status: Home / Self Care | Payer: 59 | Source: Ambulatory Visit | Attending: Internal Medicine | Admitting: Internal Medicine

## 2022-09-24 DIAGNOSIS — R1011 Right upper quadrant pain: Secondary | ICD-10-CM

## 2022-09-24 DIAGNOSIS — K7689 Other specified diseases of liver: Secondary | ICD-10-CM | POA: Diagnosis not present

## 2022-10-17 ENCOUNTER — Other Ambulatory Visit: Payer: Self-pay

## 2022-10-17 ENCOUNTER — Other Ambulatory Visit: Payer: Self-pay | Admitting: Oncology

## 2022-10-18 ENCOUNTER — Other Ambulatory Visit: Payer: Self-pay

## 2022-10-18 DIAGNOSIS — Z03818 Encounter for observation for suspected exposure to other biological agents ruled out: Secondary | ICD-10-CM | POA: Diagnosis not present

## 2022-10-18 MED FILL — Alendronate Sodium Tab 70 MG: ORAL | 28 days supply | Qty: 4 | Fill #0 | Status: AC

## 2022-10-20 ENCOUNTER — Telehealth: Payer: Self-pay | Admitting: *Deleted

## 2022-10-20 NOTE — Telephone Encounter (Signed)
Call returned to patient and informed of doctor response. She was relieved and said if she is not better next week that she will call us back

## 2022-10-20 NOTE — Telephone Encounter (Signed)
Looks like she has appt at [facility] office tomorrow. If it is something viral, it could linger for a week or longer before it gets better. Nothing more that I can offer at this time. But if she is not better by next week, she may need more extensive work up like cxr, cultures etc

## 2022-10-20 NOTE — Telephone Encounter (Signed)
Patient called reporting that since last Monday, she has been having fevers 100-101 every day in the afternoon and nights with generalized body aches from the fever. She has no other symptoms. She states that her wbc is not elevated and that she been seen in walk in clinic and worked up for this diagnosed per chart was viral illness on 3/25. She continues to have fevers and s asking if Dr Janese Banks as any recommendations about this. Please advise

## 2022-10-21 ENCOUNTER — Encounter: Payer: Self-pay | Admitting: Oncology

## 2022-10-21 ENCOUNTER — Other Ambulatory Visit: Payer: Self-pay

## 2022-10-21 DIAGNOSIS — R509 Fever, unspecified: Secondary | ICD-10-CM | POA: Diagnosis not present

## 2022-10-21 MED ORDER — DOXYCYCLINE HYCLATE 100 MG PO TABS
100.0000 mg | ORAL_TABLET | Freq: Two times a day (BID) | ORAL | 0 refills | Status: DC
  Filled 2022-10-21: qty 14, 7d supply, fill #0

## 2022-10-26 ENCOUNTER — Telehealth: Payer: Self-pay | Admitting: Gastroenterology

## 2022-10-26 ENCOUNTER — Other Ambulatory Visit: Payer: Self-pay

## 2022-10-26 MED ORDER — LINACLOTIDE 145 MCG PO CAPS
145.0000 ug | ORAL_CAPSULE | Freq: Every day | ORAL | 0 refills | Status: DC
  Filled 2022-10-26: qty 30, 30d supply, fill #0

## 2022-10-26 NOTE — Telephone Encounter (Signed)
Sent medication to the pharmacy  

## 2022-10-26 NOTE — Telephone Encounter (Signed)
Patient has not been seen since 01/06/2022 and she was given Linzess 89mcg samples at that time. Please advise if a prescription can be called in for patient

## 2022-10-26 NOTE — Telephone Encounter (Signed)
Patient states that Dr. Marius Ditch gave her samples of Linzess 145 and she has been using them and would like a prescription.

## 2022-10-26 NOTE — Addendum Note (Signed)
Addended by: Ulyess Blossom L on: 10/26/2022 04:12 PM   Modules accepted: Orders

## 2022-10-29 ENCOUNTER — Ambulatory Visit
Admission: RE | Admit: 2022-10-29 | Discharge: 2022-10-29 | Disposition: A | Discharge status: Home / Self Care | Payer: 59 | Source: Ambulatory Visit | Attending: Oncology | Admitting: Oncology

## 2022-10-29 DIAGNOSIS — C50411 Malignant neoplasm of upper-outer quadrant of right female breast: Secondary | ICD-10-CM

## 2022-10-29 DIAGNOSIS — Z170 Estrogen receptor positive status [ER+]: Secondary | ICD-10-CM

## 2022-10-29 DIAGNOSIS — Z1231 Encounter for screening mammogram for malignant neoplasm of breast: Secondary | ICD-10-CM | POA: Diagnosis not present

## 2022-10-29 DIAGNOSIS — Z853 Personal history of malignant neoplasm of breast: Secondary | ICD-10-CM | POA: Diagnosis not present

## 2022-11-01 ENCOUNTER — Other Ambulatory Visit: Payer: Self-pay | Admitting: Oncology

## 2022-11-01 DIAGNOSIS — N6489 Other specified disorders of breast: Secondary | ICD-10-CM

## 2022-11-01 DIAGNOSIS — R928 Other abnormal and inconclusive findings on diagnostic imaging of breast: Secondary | ICD-10-CM

## 2022-11-04 DIAGNOSIS — N029 Recurrent and persistent hematuria with unspecified morphologic changes: Secondary | ICD-10-CM | POA: Insufficient documentation

## 2022-11-04 DIAGNOSIS — R509 Fever, unspecified: Secondary | ICD-10-CM | POA: Diagnosis not present

## 2022-11-04 DIAGNOSIS — R319 Hematuria, unspecified: Secondary | ICD-10-CM | POA: Diagnosis not present

## 2022-11-04 DIAGNOSIS — D500 Iron deficiency anemia secondary to blood loss (chronic): Secondary | ICD-10-CM | POA: Diagnosis not present

## 2022-11-09 ENCOUNTER — Other Ambulatory Visit: Payer: Self-pay

## 2022-11-11 ENCOUNTER — Other Ambulatory Visit: Payer: Self-pay | Admitting: Internal Medicine

## 2022-11-11 DIAGNOSIS — D500 Iron deficiency anemia secondary to blood loss (chronic): Secondary | ICD-10-CM

## 2022-11-11 DIAGNOSIS — R319 Hematuria, unspecified: Secondary | ICD-10-CM

## 2022-11-11 DIAGNOSIS — R509 Fever, unspecified: Secondary | ICD-10-CM

## 2022-11-16 ENCOUNTER — Ambulatory Visit
Admission: RE | Admit: 2022-11-16 | Discharge: 2022-11-16 | Disposition: A | Discharge status: Home / Self Care | Payer: 59 | Source: Ambulatory Visit | Attending: Oncology | Admitting: Oncology

## 2022-11-16 DIAGNOSIS — R928 Other abnormal and inconclusive findings on diagnostic imaging of breast: Secondary | ICD-10-CM

## 2022-11-16 DIAGNOSIS — N6489 Other specified disorders of breast: Secondary | ICD-10-CM | POA: Diagnosis not present

## 2022-11-16 DIAGNOSIS — Z853 Personal history of malignant neoplasm of breast: Secondary | ICD-10-CM | POA: Diagnosis not present

## 2022-11-18 ENCOUNTER — Other Ambulatory Visit: Payer: Self-pay

## 2022-11-24 ENCOUNTER — Ambulatory Visit
Admission: RE | Admit: 2022-11-24 | Discharge: 2022-11-24 | Disposition: A | Discharge status: Home / Self Care | Payer: 59 | Source: Ambulatory Visit | Attending: Internal Medicine | Admitting: Internal Medicine

## 2022-11-24 DIAGNOSIS — D500 Iron deficiency anemia secondary to blood loss (chronic): Secondary | ICD-10-CM

## 2022-11-24 DIAGNOSIS — R319 Hematuria, unspecified: Secondary | ICD-10-CM | POA: Diagnosis not present

## 2022-11-24 DIAGNOSIS — R509 Fever, unspecified: Secondary | ICD-10-CM | POA: Insufficient documentation

## 2022-11-24 MED ORDER — IOHEXOL 300 MG/ML  SOLN
100.0000 mL | Freq: Once | INTRAMUSCULAR | Status: AC | PRN
  Administered 2022-11-24: 100 mL via INTRAVENOUS

## 2022-12-11 MED FILL — Alendronate Sodium Tab 70 MG: ORAL | 28 days supply | Qty: 4 | Fill #1 | Status: AC

## 2022-12-12 ENCOUNTER — Other Ambulatory Visit: Payer: Self-pay

## 2023-01-13 ENCOUNTER — Other Ambulatory Visit: Payer: Self-pay

## 2023-01-13 MED FILL — Alendronate Sodium Tab 70 MG: ORAL | 28 days supply | Qty: 4 | Fill #2 | Status: AC

## 2023-02-04 ENCOUNTER — Other Ambulatory Visit: Payer: Self-pay

## 2023-02-04 DIAGNOSIS — I10 Essential (primary) hypertension: Secondary | ICD-10-CM | POA: Insufficient documentation

## 2023-02-04 DIAGNOSIS — R399 Unspecified symptoms and signs involving the genitourinary system: Secondary | ICD-10-CM | POA: Diagnosis not present

## 2023-02-04 MED ORDER — NITROFURANTOIN MONOHYD MACRO 100 MG PO CAPS
100.0000 mg | ORAL_CAPSULE | Freq: Two times a day (BID) | ORAL | 0 refills | Status: DC
  Filled 2023-02-04: qty 10, 5d supply, fill #0

## 2023-02-07 DIAGNOSIS — N390 Urinary tract infection, site not specified: Secondary | ICD-10-CM | POA: Diagnosis not present

## 2023-02-07 MED FILL — Alendronate Sodium Tab 70 MG: ORAL | 28 days supply | Qty: 4 | Fill #3 | Status: AC

## 2023-02-17 ENCOUNTER — Ambulatory Visit: Payer: 59 | Admitting: Physician Assistant

## 2023-02-17 ENCOUNTER — Other Ambulatory Visit: Payer: Self-pay

## 2023-02-17 ENCOUNTER — Telehealth: Payer: Self-pay

## 2023-02-17 VITALS — BP 138/79 | HR 75 | Ht 62.0 in | Wt 134.0 lb

## 2023-02-17 DIAGNOSIS — R3129 Other microscopic hematuria: Secondary | ICD-10-CM | POA: Diagnosis not present

## 2023-02-17 DIAGNOSIS — N3281 Overactive bladder: Secondary | ICD-10-CM

## 2023-02-17 DIAGNOSIS — Z87440 Personal history of urinary (tract) infections: Secondary | ICD-10-CM

## 2023-02-17 DIAGNOSIS — N390 Urinary tract infection, site not specified: Secondary | ICD-10-CM

## 2023-02-17 DIAGNOSIS — R3915 Urgency of urination: Secondary | ICD-10-CM | POA: Diagnosis not present

## 2023-02-17 LAB — URINALYSIS, COMPLETE
Bilirubin, UA: NEGATIVE
Glucose, UA: NEGATIVE
Ketones, UA: NEGATIVE
Leukocytes,UA: NEGATIVE
Nitrite, UA: NEGATIVE
Protein,UA: NEGATIVE
Specific Gravity, UA: <1.005 — ABNORMAL LOW (ref 1.005–1.030)
Urobilinogen, Ur: 0.2 mg/dL (ref 0.2–1.0)
pH, UA: 5.5 (ref 5.0–7.5)

## 2023-02-17 LAB — MICROSCOPIC EXAMINATION

## 2023-02-17 MED ORDER — GEMTESA 75 MG PO TABS: 75.0000 mg | ORAL_TABLET | Freq: Every day | ORAL | 0 refills | Status: DC

## 2023-02-17 MED ORDER — ESTRADIOL 0.1 MG/GM VA CREA
TOPICAL_CREAM | VAGINAL | 12 refills | Status: DC
Start: 2023-02-17 — End: 2024-04-10
  Filled 2023-02-17: qty 42.5, 90d supply, fill #0
  Filled 2023-06-06: qty 42.5, 90d supply, fill #1
  Filled 2024-01-06: qty 42.5, 90d supply, fill #2

## 2023-02-17 NOTE — Progress Notes (Signed)
02/17/2023 1:08 PM   Sheri [name] 11-30-1960 [id number]  CC: Chief Complaint  Patient presents with   Follow-up   HPI: Sheri [name] is a 62 y.o. female with PMH stage I breast cancer in 2021 on tamoxifen, microscopic hematuria with benign workup in 2022, and OAB who presents today for evaluation of recurrent UTI.   Today she reports for urinary tract infections this year, 3 of which were associated with fevers.  Her symptoms are typically bladder pressure rather than dysuria.  They resolved with antibiotics.  She is asymptomatic today.  No gross hematuria.  Per chart review, I only see 1 positive urine culture from this year, dated 02/04/2023.  At the time, she grew low colony counts of pansensitive E. coli.  She reports daytime frequency every 30 minutes at baseline, nocturia x 2-3, urgency when she is home, and occasional small-volume urge incontinence.  She denies stress incontinence and is s/p bladder suspension by Dr. Barnabas Lister around 2008.  She still occasionally takes oxybutynin that Dr. Apolinar Junes previously prescribed.  She has started taking vitamin C supplements twice daily for UTI prevention.  In-office UA today positive for 1+ blood; urine microscopy pan negative.  PMH: Past Medical History:  Diagnosis Date   Arthritis    Breast cancer (HCC) 2020   right breast   Dysrhythmia    tachycardia   Family history of breast cancer    Family history of prostate cancer    GERD (gastroesophageal reflux disease)    Hypertension    Hypothyroidism    Personal history of radiation therapy    Vitamin D deficiency     Surgical History: Past Surgical History:  Procedure Laterality Date   ABDOMINAL HYSTERECTOMY     complete   BREAST BIOPSY Left 2013   negative core   BREAST BIOPSY Right 2021   INVASIVE MAMMARY CARCINOMA Grade 1. Ductal carcinoma in situ: Present, low-grade   BREAST EXCISIONAL BIOPSY Right 01/01/2020   RF tag placement [id number]   BREAST LUMPECTOMY Right  01/07/2020   CESAREAN SECTION     COLONOSCOPY WITH PROPOFOL N/A 02/16/2022   Procedure: COLONOSCOPY WITH PROPOFOL;  Surgeon: Toney Reil, MD;  Location: [facility];  Service: Endoscopy;  Laterality: N/A;   INCONTINENCE SURGERY     NASAL SINUS SURGERY Left 2010   PARTIAL MASTECTOMY WITH NEEDLE LOCALIZATION AND AXILLARY SENTINEL LYMPH NODE BX Right 01/07/2020   Procedure: PARTIAL MASTECTOMY WITH RADIOFREQUENCY TAG, RIGHT AXILLARY SENTINEL LYMPH NODE BIOPSY;  Surgeon: Carolan Shiver, MD;  Location: [facility];  Service: General;  Laterality: Right;   POLYPECTOMY  02/16/2022   Procedure: POLYPECTOMY;  Surgeon: Toney Reil, MD;  Location: [facility];  Service: Endoscopy;;   TONSILLECTOMY      Home Medications:  Allergies as of 02/17/2023       Reactions   Tape Other (See Comments)   Tears skin/redness (PAPER TAPE OK and tegaderm)_        Medication List        Accurate as of February 17, 2023  1:08 PM. If you have any questions, ask your nurse or doctor.          STOP taking these medications    doxycycline 100 MG tablet Commonly known as: VIBRA-TABS Stopped by: Carman Ching   levothyroxine 50 MCG tablet Commonly known as: SYNTHROID Stopped by: Carman Ching   nitrofurantoin (macrocrystal-monohydrate) 100 MG capsule Commonly known as: MACROBID Stopped by: Carman Ching   Shingrix injection Generic drug:  Zoster Vaccine Adjuvanted Stopped by: Carman Ching       TAKE these medications    acetaminophen 500 MG tablet Commonly known as: TYLENOL Take 500-1,000 mg by mouth every 6 (six) hours as needed (for pain.).   alendronate 70 MG tablet Commonly known as: FOSAMAX Take 1 tablet (70 mg total) by mouth once a week. Take with a full glass of water on an empty stomach.   CALCIUM 1200+D3 PO Take 1 Dose by mouth daily.   calcium carbonate 500 MG chewable tablet Commonly known as: TUMS - dosed in mg  elemental calcium Chew 1-2 tablets by mouth daily as needed for indigestion or heartburn.   Cholecalciferol 25 MCG (1000 UT) tablet Take 1,000 Units by mouth 2 (two) times a week.   estradiol 0.1 MG/GM vaginal cream Commonly known as: ESTRACE Apply one pea-sized amount around the opening of the urethra daily for 2 weeks, then 3 times weekly moving forward. Started by: Cindra Eves 75 MG Tabs Generic drug: Vibegron Take 1 tablet (75 mg total) by mouth daily. Started by: Carman Ching   hydrochlorothiazide 12.5 MG capsule Commonly known as: MICROZIDE Take 12.5 mg by mouth daily as needed (elevated blood pressure/fluid retention.).   Linzess 145 MCG Caps capsule Generic drug: linaclotide Take 1 capsule (145 mcg total) by mouth daily before breakfast.   metoprolol succinate 25 MG 24 hr tablet Commonly known as: TOPROL-XL TAKE 1/2 TABLET BY MOUTH THREE TIMES A WEEK What changed: Another medication with the same name was removed. Continue taking this medication, and follow the directions you see here. Changed by: Carman Ching   polyethylene glycol 17 g packet Commonly known as: MIRALAX / GLYCOLAX Take by mouth.   tamoxifen 20 MG tablet Commonly known as: NOLVADEX Take 1 tablet (20 mg total) by mouth daily.   traZODone 50 MG tablet Commonly known as: DESYREL Take 1 tablet (50 mg total) by mouth at bedtime.   VITAMIN B COMPLEX PO Take 1 tablet by mouth once a week.        Allergies:  Allergies  Allergen Reactions   Tape Other (See Comments)    Tears skin/redness (PAPER TAPE OK and tegaderm)_    Family History: Family History  Problem Relation Age of Onset   Breast cancer Maternal Grandmother        dx late 65s   Prostate cancer Father        dx 56s   Prostate cancer Brother        dx 48s-50s   Cancer Paternal Grandmother        tumor on ureter dx 47s    Social History:   reports that she has quit smoking. She has never  used smokeless tobacco. She reports that she does not currently use alcohol. She reports that she does not use drugs.  Physical Exam: BP 138/79   Pulse 75   Ht 5\' 2"  (1.575 m)   Wt 134 lb (60.8 kg)   BMI 24.51 kg/m   Constitutional:  Alert and oriented, no acute distress, nontoxic appearing HEENT: [place], AT Cardiovascular: No clubbing, cyanosis, or edema Respiratory: Normal respiratory effort, no increased work of breathing Skin: No rashes, bruises or suspicious lesions Neurologic: Grossly intact, no focal deficits, moving all 4 extremities Psychiatric: Normal mood and affect  Laboratory Data: Results for orders placed or performed in visit on 02/17/23  Microscopic Examination   Urine  Result Value Ref Range   WBC, UA 0-5 0 - 5 /  hpf   RBC, Urine 0-2 0 - 2 /hpf   Epithelial Cells (non renal) 0-10 0 - 10 /hpf   Bacteria, UA Few None seen/Few  Urinalysis, Complete  Result Value Ref Range   Specific Gravity, UA <1.005 (L) 1.005 - 1.030   pH, UA 5.5 5.0 - 7.5   Color, UA Yellow Yellow   Appearance Ur Clear Clear   Leukocytes,UA Negative Negative   Protein,UA Negative Negative/Trace   Glucose, UA Negative Negative   Ketones, UA Negative Negative   RBC, UA 1+ (A) Negative   Bilirubin, UA Negative Negative   Urobilinogen, Ur 0.2 0.2 - 1.0 mg/dL   Nitrite, UA Negative Negative   Microscopic Examination See below:    Assessment & Plan:   1. Recurrent UTI UA bland today and she is asymptomatic.  I do not have culture data to confirm 4 UTIs this year, however with reports of febrile episodes, I do think it would be appropriate to start topical vaginal estrogen cream for UTI prevention.  We also discussed staying well-hydrated and starting cranberry supplements.  I spoke with Dr. Smith Robert, who feels it would be appropriate to put her on topical vaginal estrogen cream while on tamoxifen. - Urinalysis, Complete - estradiol (ESTRACE) 0.1 MG/GM vaginal cream; Apply one pea-sized amount around  the opening of the urethra daily for 2 weeks, then 3 times weekly moving forward.  Dispense: 42.5 g; Refill: 12  2. OAB (overactive bladder) OAB wet.  I offered her Gemtesa samples today and she accepted.  Will see her back in 6 weeks for symptom recheck/PVR.  We discussed that topical vaginal estrogen cream should help with this as well. - Vibegron (GEMTESA) 75 MG TABS; Take 1 tablet (75 mg total) by mouth daily.  Dispense: 42 tablet; Refill: 0  3. Microscopic hematuria None seen today, and per chart review it appears this has improved since she was last seen in our clinic.  Will continue to monitor with serial UAs.  We discussed that if she develops gross hematuria, she should seek care with [place].  Return in about 6 weeks (around 03/31/2023) for Symptom recheck with PVR.  Carman Ching, PA-C  [facility] [place] [address], [address] [place], [place] [address] [phone]

## 2023-02-17 NOTE — Telephone Encounter (Signed)
LVM for patient to return call.  To let her know Dr. Smith Robert said would be ok to use the estrogen cream.

## 2023-02-23 ENCOUNTER — Inpatient Hospital Stay: Payer: 59 | Attending: Oncology | Admitting: Oncology

## 2023-02-23 ENCOUNTER — Encounter: Payer: Self-pay | Admitting: Oncology

## 2023-02-23 VITALS — BP 117/83 | HR 69 | Temp 97.1°F | Resp 18 | Ht 62.0 in | Wt 132.0 lb

## 2023-02-23 DIAGNOSIS — M810 Age-related osteoporosis without current pathological fracture: Secondary | ICD-10-CM | POA: Diagnosis not present

## 2023-02-23 DIAGNOSIS — Z79810 Long term (current) use of selective estrogen receptor modulators (SERMs): Secondary | ICD-10-CM | POA: Diagnosis not present

## 2023-02-23 DIAGNOSIS — Z853 Personal history of malignant neoplasm of breast: Secondary | ICD-10-CM | POA: Diagnosis not present

## 2023-02-23 DIAGNOSIS — Z170 Estrogen receptor positive status [ER+]: Secondary | ICD-10-CM

## 2023-02-23 DIAGNOSIS — M818 Other osteoporosis without current pathological fracture: Secondary | ICD-10-CM

## 2023-02-23 DIAGNOSIS — C50411 Malignant neoplasm of upper-outer quadrant of right female breast: Secondary | ICD-10-CM | POA: Diagnosis not present

## 2023-02-23 DIAGNOSIS — Z5181 Encounter for therapeutic drug level monitoring: Secondary | ICD-10-CM

## 2023-02-23 DIAGNOSIS — Z08 Encounter for follow-up examination after completed treatment for malignant neoplasm: Secondary | ICD-10-CM

## 2023-02-23 NOTE — Research (Signed)
WF [id number] Understanding and Predicting Breast Cancer Events after Treatment (UPBEAT)   3 Year Follow Up:   Patient in to the cancer center today for her scheduled follow up with Dr. Smith Robert. Research nurse met with the patient prior to her visit with Dr. Smith Robert to complete the required [facility] Cardiomyopathy Questionnaire [facility]) and the Cardiovascular Event Form. Patient completed the questionnaire without any difficulties. The patient states she has not been hospitalized since her last visit for the protocol. She denies any cardiac conditions, MI, CVA, PCI, CABG or heart failure. There have been no changes in her medical conditions other than urinary frequency. The patient is aware there will be a follow up phone call next year per protocol requirements. She was encouraged to call if she has any questions or concerns for research.  Chriss Driver, RN 02/23/23 12:00 PM

## 2023-02-23 NOTE — Progress Notes (Signed)
Hematology/Oncology Consult note [facility]  [phone] [phone]  Patient Care Team: Danella Penton, MD as PCP - General (Internal Medicine) Creig Hines, MD as Consulting Physician (Oncology) Carolan Shiver, MD as Consulting Physician (General Surgery) Carmina Miller, MD as Referring Physician (Radiation Oncology) Jim Like, RN as Registered Nurse Chriss Driver, RN as Registered Nurse   Name of the patient: Sheri [name]  [id number]  21-Feb-1961   Date of visit: 02/23/23  Diagnosis-history of stage I breast cancer  Chief complaint/ Reason for visit-routine follow-up of breast cancer on tamoxifen  Heme/Onc history: Patient is a 62 year old female with a past medical history significant for hypertension who recently underwent a screening mammogram on 10/18/2019 which showed a possible mass in the right breast.  This was followed by a diagnostic mammogram and ultrasound which showed hypoechoic mass measuring 0.8 x 0.8 x 0.9 cm at 10 o'clock position in the right breast 7 cm from the nipple.  Right axilla demonstrates normal-appearing lymph nodes.  This was biopsied and was consistent with invasive mammary carcinoma grade 1, 8 mm ER 91 -100% positive, PR 11 to 20% positive and HER-2/neu negative.    Patient is doing well for her age and denies any complaints at this time.  Menarche at the age of 67.  She used hormone replacement therapy for a few years in the past.  G2, P2 L2.  No prior breast biopsies.  Age at first pregnancy 28 years.  Menopause in her 94s.   Final pathology showed 2 separate foci of tumor 5 mm and 8 mm in size.  No malignancy in the intervening tissue.  4 sentinel lymph nodes negative for malignancy.  Margins negative.  Tumor was overall grade 1 ER/PR positive and HER-2 negative patient completed adjuvant radiation treatment and Started Arimidex in September 2021.  She could not tolerate Arimidex due to  arthralgias and was switched to exemestane.  Patient could not tolerate that as well and was eventually switched to tamoxifen  Interval history-patient was having issues with vaginal dryness as well as recurrent UTIs and was recently recommended to try topical estrogen.  Patient denies any breast concerns at this time.  ECOG PS- 0 Pain scale- 0   Review of systems- Review of Systems  Constitutional:  Positive for malaise/fatigue. Negative for chills, fever and weight loss.  HENT:  Negative for congestion, ear discharge and nosebleeds.   Eyes:  Negative for blurred vision.  Respiratory:  Negative for cough, hemoptysis, sputum production, shortness of breath and wheezing.   Cardiovascular:  Negative for chest pain, palpitations, orthopnea and claudication.  Gastrointestinal:  Negative for abdominal pain, blood in stool, constipation, diarrhea, heartburn, melena, nausea and vomiting.  Genitourinary:  Negative for dysuria, flank pain, frequency, hematuria and urgency.       Vaginal dryness  Musculoskeletal:  Negative for back pain, joint pain and myalgias.  Skin:  Negative for rash.  Neurological:  Negative for dizziness, tingling, focal weakness, seizures, weakness and headaches.  Endo/Heme/Allergies:  Does not bruise/bleed easily.  Psychiatric/Behavioral:  Negative for depression and suicidal ideas. The patient does not have insomnia.       Allergies  Allergen Reactions   Tape Other (See Comments)    Tears skin/redness (PAPER TAPE OK and tegaderm)_     Past Medical History:  Diagnosis Date   Arthritis    Breast cancer (HCC) 2020   right breast   Dysrhythmia    tachycardia   Family  history of breast cancer    Family history of prostate cancer    GERD (gastroesophageal reflux disease)    Hypertension    Hypothyroidism    Personal history of radiation therapy    Vitamin D deficiency      Past Surgical History:  Procedure Laterality Date   ABDOMINAL HYSTERECTOMY      complete   BREAST BIOPSY Left 2013   negative core   BREAST BIOPSY Right 2021   INVASIVE MAMMARY CARCINOMA Grade 1. Ductal carcinoma in situ: Present, low-grade   BREAST EXCISIONAL BIOPSY Right 01/01/2020   RF tag placement [id number]   BREAST LUMPECTOMY Right 01/07/2020   CESAREAN SECTION     COLONOSCOPY WITH PROPOFOL N/A 02/16/2022   Procedure: COLONOSCOPY WITH PROPOFOL;  Surgeon: Toney Reil, MD;  Location: [facility];  Service: Endoscopy;  Laterality: N/A;   INCONTINENCE SURGERY     NASAL SINUS SURGERY Left 2010   PARTIAL MASTECTOMY WITH NEEDLE LOCALIZATION AND AXILLARY SENTINEL LYMPH NODE BX Right 01/07/2020   Procedure: PARTIAL MASTECTOMY WITH RADIOFREQUENCY TAG, RIGHT AXILLARY SENTINEL LYMPH NODE BIOPSY;  Surgeon: Carolan Shiver, MD;  Location: [facility];  Service: General;  Laterality: Right;   POLYPECTOMY  02/16/2022   Procedure: POLYPECTOMY;  Surgeon: Toney Reil, MD;  Location: [facility];  Service: Endoscopy;;   TONSILLECTOMY      Social History   Socioeconomic History   Marital status: Divorced    Spouse name: Not on file   Number of children: Not on file   Years of education: Not on file   Highest education level: Not on file  Occupational History   Not on file  Tobacco Use   Smoking status: Former   Smokeless tobacco: Never   Tobacco comments:    33 years ago  Vaping Use   Vaping status: Never Used  Substance and Sexual Activity   Alcohol use: Not Currently   Drug use: Never   Sexual activity: Not on file  Other Topics Concern   Not on file  Social History Narrative   Not on file   Social Determinants of Health   Financial Resource Strain: Not on file  Food Insecurity: Not on file  Transportation Needs: Not on file  Physical Activity: Not on file  Stress: Not on file  Social Connections: Not on file  Intimate Partner Violence: Not on file    Family History  Problem Relation Age of Onset   Breast cancer Maternal  Grandmother        dx late 30s   Prostate cancer Father        dx 38s   Prostate cancer Brother        dx 45s-50s   Cancer Paternal Grandmother        tumor on ureter dx 80s     Current Outpatient Medications:    acetaminophen (TYLENOL) 500 MG tablet, Take 500-1,000 mg by mouth every 6 (six) hours as needed (for pain.)., Disp: , Rfl:    alendronate (FOSAMAX) 70 MG tablet, Take 1 tablet (70 mg total) by mouth once a week. Take with a full glass of water on an empty stomach., Disp: 4 tablet, Rfl: 5   B Complex Vitamins (VITAMIN B COMPLEX PO), Take 1 tablet by mouth once a week., Disp: , Rfl:    calcium carbonate (TUMS - DOSED IN MG ELEMENTAL CALCIUM) 500 MG chewable tablet, Chew 1-2 tablets by mouth daily as needed for indigestion or heartburn., Disp: , Rfl:  Calcium-Magnesium-Vitamin D (CALCIUM 1200+D3 PO), Take 1 Dose by mouth daily., Disp: , Rfl:    Cholecalciferol 25 MCG (1000 UT) tablet, Take 1,000 Units by mouth 2 (two) times a week. , Disp: , Rfl:    estradiol (ESTRACE) 0.1 MG/GM vaginal cream, Apply one pea-sized amount around the opening of the urethra daily for 2 weeks, then 3 times weekly moving forward., Disp: 42.5 g, Rfl: 12   hydrochlorothiazide (MICROZIDE) 12.5 MG capsule, Take 12.5 mg by mouth daily as needed (elevated blood pressure/fluid retention.). , Disp: , Rfl:    linaclotide (LINZESS) 145 MCG CAPS capsule, Take 1 capsule (145 mcg total) by mouth daily before breakfast., Disp: 30 capsule, Rfl: 0   metoprolol succinate (TOPROL-XL) 25 MG 24 hr tablet, TAKE 1/2 TABLET BY MOUTH THREE TIMES A WEEK, Disp: 36 tablet, Rfl: 0   polyethylene glycol (MIRALAX / GLYCOLAX) 17 g packet, Take by mouth., Disp: , Rfl:    tamoxifen (NOLVADEX) 20 MG tablet, Take 1 tablet (20 mg total) by mouth daily., Disp: 30 tablet, Rfl: 3   traZODone (DESYREL) 50 MG tablet, Take 1 tablet (50 mg total) by mouth at bedtime., Disp: 90 tablet, Rfl: 3   Vibegron (GEMTESA) 75 MG TABS, Take 1 tablet (75 mg  total) by mouth daily., Disp: 42 tablet, Rfl: 0  Physical exam:  Vitals:   02/23/23 1030  BP: 117/83  Pulse: 69  Resp: 18  Temp: (!) 97.1 F (36.2 C)  TempSrc: Tympanic  SpO2: 100%  Weight: 132 lb (59.9 kg)  Height: 5\' 2"  (1.575 m)   Physical Exam Cardiovascular:     Rate and Rhythm: Normal rate and regular rhythm.     Heart sounds: Normal heart sounds.  Pulmonary:     Effort: Pulmonary effort is normal.     Breath sounds: Normal breath sounds.  Abdominal:     General: Bowel sounds are normal.     Palpations: Abdomen is soft.  Skin:    General: Skin is warm and dry.  Neurological:     Mental Status: She is alert and oriented to person, place, and time.         Latest Ref Rng & Units 03/06/2021    9:31 AM  CMP  Glucose 70 - 99 mg/dL 710   BUN 6 - 20 mg/dL 12   Creatinine 6.26 - 1.00 mg/dL 9.48   Sodium 546 - 270 mmol/L 137   Potassium 3.5 - 5.1 mmol/L 4.4   Chloride 98 - 111 mmol/L 99   CO2 22 - 32 mmol/L 29   Calcium 8.9 - 10.3 mg/dL 9.4   Total Protein 6.5 - 8.1 g/dL 7.0   Total Bilirubin 0.3 - 1.2 mg/dL 0.7   Alkaline Phos 38 - 126 U/L 81   AST 15 - 41 U/L 18   ALT 0 - 44 U/L 13       Latest Ref Rng & Units 03/06/2021    9:31 AM  CBC  WBC 4.0 - 10.5 K/uL 6.0   Hemoglobin 12.0 - 15.0 g/dL 35.0   Hematocrit 09.3 - 46.0 % 38.4   Platelets 150 - 400 K/uL 249      Assessment and plan- Patient is a 62 y.o. female with history of right breast cancer stage I ER/PR positive HER2 negative.  She is currently on tamoxifen and this is a routine follow-up visit for breast cancer  With regards to use of vaginal estrogen for atrophic vaginitis and recurrent UTIs-I discussed small but unpredictable systemic  absorption of estrogen with the use of vaginal estrogen in the setting of ER positive breast cancer.  However given the risks versus benefits I think it would be okay for patient to proceed with topical estrogen if it is helping her.  Patient will continue to stay  on tamoxifen for her stage I breast cancer which was diagnosed in 2021 March and she will be on it for 5 years.  She also has baseline osteoporosis for which she is on Fosamax.  I will see her back in 6 months   Visit Diagnosis 1. Encounter for follow-up surveillance of breast cancer   2. Encounter for monitoring tamoxifen therapy   3. Other osteoporosis without current pathological fracture      Dr. Owens Shark, MD, MPH [facility] [phone] 02/23/2023 1:11 PM

## 2023-03-01 ENCOUNTER — Other Ambulatory Visit: Payer: Self-pay

## 2023-03-01 ENCOUNTER — Other Ambulatory Visit: Payer: Self-pay | Admitting: Oncology

## 2023-03-01 MED ORDER — TAMOXIFEN CITRATE 20 MG PO TABS
20.0000 mg | ORAL_TABLET | Freq: Every day | ORAL | 3 refills | Status: DC
  Filled 2023-03-01: qty 30, 30d supply, fill #0
  Filled 2023-06-06: qty 30, 30d supply, fill #1
  Filled 2023-08-01: qty 30, 30d supply, fill #2
  Filled 2023-10-06: qty 30, 30d supply, fill #3

## 2023-03-01 MED FILL — Alendronate Sodium Tab 70 MG: ORAL | 28 days supply | Qty: 4 | Fill #4 | Status: AC

## 2023-03-31 ENCOUNTER — Ambulatory Visit: Payer: 59 | Admitting: Physician Assistant

## 2023-04-04 ENCOUNTER — Ambulatory Visit: Payer: 59 | Admitting: Physician Assistant

## 2023-04-04 VITALS — BP 118/76 | HR 61 | Ht 62.0 in | Wt 135.4 lb

## 2023-04-04 DIAGNOSIS — Z87440 Personal history of urinary (tract) infections: Secondary | ICD-10-CM

## 2023-04-04 DIAGNOSIS — N390 Urinary tract infection, site not specified: Secondary | ICD-10-CM

## 2023-04-04 DIAGNOSIS — R3129 Other microscopic hematuria: Secondary | ICD-10-CM

## 2023-04-04 DIAGNOSIS — N3281 Overactive bladder: Secondary | ICD-10-CM

## 2023-04-04 LAB — BLADDER SCAN AMB NON-IMAGING: Scan Result: 0

## 2023-04-04 NOTE — Progress Notes (Signed)
04/04/2023 10:51 AM   [name] [birth year] [id number]  CC: Chief Complaint  Patient presents with   Over Active Bladder   HPI: Sheri [name] is a 62 y.o. female with PMH breast cancer previously on tamoxifen, microscopic hematuria with benign workup in 2022, possible recurrent UTI, and OAB wet who presents today for symptom recheck on topical vaginal estrogen cream and Gemtesa.   Today she reports she is doing very well on [place].  She denies urge incontinence, though admits to a couple of close calls.  Her urgency has decreased considerably.  Nocturia, previously x 4-5, is now x 1-2.  She has been using estrogen cream 3 times weekly but has not noticed a difference yet.  She denies dysuria or gross hematuria.  PVR 0 mL.  PMH: Past Medical History:  Diagnosis Date   Arthritis    Breast cancer (HCC) 2020   right breast   Dysrhythmia    tachycardia   Family history of breast cancer    Family history of prostate cancer    GERD (gastroesophageal reflux disease)    Hypertension    Hypothyroidism    Personal history of radiation therapy    Vitamin D deficiency     Surgical History: Past Surgical History:  Procedure Laterality Date   ABDOMINAL HYSTERECTOMY     complete   BREAST BIOPSY Left 2013   negative core   BREAST BIOPSY Right 2021   INVASIVE MAMMARY CARCINOMA Grade 1. Ductal carcinoma in situ: Present, low-grade   BREAST EXCISIONAL BIOPSY Right 01/01/2020   RF tag placement [id number]   BREAST LUMPECTOMY Right 01/07/2020   CESAREAN SECTION     COLONOSCOPY WITH PROPOFOL N/A 02/16/2022   Procedure: COLONOSCOPY WITH PROPOFOL;  Surgeon: Toney Reil, MD;  Location: [facility];  Service: Endoscopy;  Laterality: N/A;   INCONTINENCE SURGERY     NASAL SINUS SURGERY Left 2010   PARTIAL MASTECTOMY WITH NEEDLE LOCALIZATION AND AXILLARY SENTINEL LYMPH NODE BX Right 01/07/2020   Procedure: PARTIAL MASTECTOMY WITH RADIOFREQUENCY TAG, RIGHT AXILLARY SENTINEL  LYMPH NODE BIOPSY;  Surgeon: Carolan Shiver, MD;  Location: [facility];  Service: General;  Laterality: Right;   POLYPECTOMY  02/16/2022   Procedure: POLYPECTOMY;  Surgeon: Toney Reil, MD;  Location: [facility];  Service: Endoscopy;;   TONSILLECTOMY      Home Medications:  Allergies as of 04/04/2023       Reactions   Tape Other (See Comments)   Tears skin/redness (PAPER TAPE OK and tegaderm)_        Medication List        Accurate as of April 04, 2023 10:51 AM. If you have any questions, ask your nurse or doctor.          STOP taking these medications    hydrochlorothiazide 12.5 MG capsule Commonly known as: MICROZIDE   polyethylene glycol 17 g packet Commonly known as: MIRALAX / GLYCOLAX       TAKE these medications    acetaminophen 500 MG tablet Commonly known as: TYLENOL Take 500-1,000 mg by mouth every 6 (six) hours as needed (for pain.).   alendronate 70 MG tablet Commonly known as: FOSAMAX Take 1 tablet (70 mg total) by mouth once a week. Take with a full glass of water on an empty stomach.   CALCIUM 1200+D3 PO Take 1 Dose by mouth daily.   calcium carbonate 500 MG chewable tablet Commonly known as: TUMS - dosed in mg elemental calcium Chew 1-2  tablets by mouth daily as needed for indigestion or heartburn.   Cholecalciferol 25 MCG (1000 UT) tablet Take 1,000 Units by mouth 2 (two) times a week.   estradiol 0.1 MG/GM vaginal cream Commonly known as: ESTRACE Apply one pea-sized amount around the opening of the urethra daily for 2 weeks, then 3 times weekly moving forward.   Gemtesa 75 MG Tabs Generic drug: Vibegron Take 1 tablet (75 mg total) by mouth daily.   Linzess 145 MCG Caps capsule Generic drug: linaclotide Take 1 capsule (145 mcg total) by mouth daily before breakfast.   metoprolol succinate 25 MG 24 hr tablet Commonly known as: TOPROL-XL TAKE 1/2 TABLET BY MOUTH THREE TIMES A WEEK   tamoxifen 20 MG  tablet Commonly known as: NOLVADEX Take 1 tablet (20 mg total) by mouth daily.   traZODone 50 MG tablet Commonly known as: DESYREL Take 1 tablet (50 mg total) by mouth at bedtime.   VITAMIN B COMPLEX PO Take 1 tablet by mouth once a week.        Allergies:  Allergies  Allergen Reactions   Tape Other (See Comments)    Tears skin/redness (PAPER TAPE OK and tegaderm)_    Family History: Family History  Problem Relation Age of Onset   Breast cancer Maternal Grandmother        dx late 76s   Prostate cancer Father        dx 67s   Prostate cancer Brother        dx 37s-50s   Cancer Paternal Grandmother        tumor on ureter dx 63s    Social History:   reports that she has quit smoking. She has never used smokeless tobacco. She reports that she does not currently use alcohol. She reports that she does not use drugs.  Physical Exam: BP 118/76   Pulse 61   Ht 5\' 2"  (1.575 m)   Wt 135 lb 6 oz (61.4 kg)   BMI 24.76 kg/m   Constitutional:  Alert and oriented, no acute distress, nontoxic appearing HEENT: [place], AT Cardiovascular: No clubbing, cyanosis, or edema Respiratory: Normal respiratory effort, no increased work of breathing Skin: No rashes, bruises or suspicious lesions Neurologic: Grossly intact, no focal deficits, moving all 4 extremities Psychiatric: Normal mood and affect  Laboratory Data: Results for orders placed or performed in visit on 04/04/23  Bladder Scan (Post Void Residual) in office  Result Value Ref Range   Scan Result 0 ml    Assessment & Plan:   1. OAB (overactive bladder) Well-controlled on Gemtesa, will continue.  She is emptying appropriately. - Bladder Scan (Post Void Residual) in office  2. Recurrent UTI No irritative voiding symptoms since starting topical vaginal estrogen cream, will continue this.  3. Microscopic hematuria Will plan for repeat UA at her next annual follow-up.  Return in about 1 year (around 04/03/2024) for Annual OAB  follow-up with UA, PVR.  Carman Ching, PA-C  [facility] [place] [address] [place], [place] [address] [phone]

## 2023-04-05 ENCOUNTER — Other Ambulatory Visit: Payer: Self-pay

## 2023-04-05 MED FILL — Alendronate Sodium Tab 70 MG: ORAL | 28 days supply | Qty: 4 | Fill #5 | Status: AC

## 2023-04-26 ENCOUNTER — Other Ambulatory Visit: Payer: Self-pay

## 2023-04-27 ENCOUNTER — Other Ambulatory Visit: Payer: Self-pay | Admitting: *Deleted

## 2023-04-27 DIAGNOSIS — N3281 Overactive bladder: Secondary | ICD-10-CM

## 2023-04-27 MED ORDER — GEMTESA 75 MG PO TABS: 75.0000 mg | ORAL_TABLET | Freq: Every day | ORAL | 11 refills | Status: DC

## 2023-04-28 ENCOUNTER — Other Ambulatory Visit: Payer: Self-pay

## 2023-05-03 ENCOUNTER — Other Ambulatory Visit: Payer: Self-pay

## 2023-05-03 DIAGNOSIS — N3281 Overactive bladder: Secondary | ICD-10-CM

## 2023-05-03 MED ORDER — GEMTESA 75 MG PO TABS
75.0000 mg | ORAL_TABLET | Freq: Every day | ORAL | 11 refills | Status: DC
Start: 2023-05-03 — End: 2023-09-07
  Filled 2023-05-03 – 2023-06-30 (×4): qty 30, 30d supply, fill #0

## 2023-05-04 ENCOUNTER — Other Ambulatory Visit: Payer: Self-pay

## 2023-05-08 ENCOUNTER — Other Ambulatory Visit: Payer: Self-pay | Admitting: Oncology

## 2023-05-08 ENCOUNTER — Other Ambulatory Visit: Payer: Self-pay

## 2023-05-09 ENCOUNTER — Other Ambulatory Visit: Payer: Self-pay

## 2023-05-09 MED FILL — Alendronate Sodium Tab 70 MG: ORAL | 28 days supply | Qty: 4 | Fill #0 | Status: AC

## 2023-05-13 ENCOUNTER — Other Ambulatory Visit: Payer: Self-pay

## 2023-05-25 ENCOUNTER — Other Ambulatory Visit: Payer: Self-pay

## 2023-05-25 DIAGNOSIS — H43813 Vitreous degeneration, bilateral: Secondary | ICD-10-CM | POA: Diagnosis not present

## 2023-05-26 ENCOUNTER — Other Ambulatory Visit: Payer: Self-pay

## 2023-05-27 ENCOUNTER — Other Ambulatory Visit: Payer: Self-pay

## 2023-05-29 ENCOUNTER — Other Ambulatory Visit: Payer: Self-pay

## 2023-05-31 ENCOUNTER — Other Ambulatory Visit: Payer: Self-pay

## 2023-06-02 ENCOUNTER — Other Ambulatory Visit: Payer: Self-pay

## 2023-06-03 ENCOUNTER — Other Ambulatory Visit: Payer: Self-pay

## 2023-06-06 ENCOUNTER — Other Ambulatory Visit: Payer: Self-pay

## 2023-06-06 MED FILL — Alendronate Sodium Tab 70 MG: ORAL | 28 days supply | Qty: 4 | Fill #1 | Status: AC

## 2023-06-07 ENCOUNTER — Other Ambulatory Visit: Payer: Self-pay

## 2023-06-09 DIAGNOSIS — H43813 Vitreous degeneration, bilateral: Secondary | ICD-10-CM | POA: Diagnosis not present

## 2023-06-24 ENCOUNTER — Other Ambulatory Visit: Payer: Self-pay

## 2023-06-26 ENCOUNTER — Other Ambulatory Visit: Payer: Self-pay

## 2023-06-30 ENCOUNTER — Other Ambulatory Visit: Payer: Self-pay

## 2023-06-30 MED FILL — Alendronate Sodium Tab 70 MG: ORAL | 28 days supply | Qty: 4 | Fill #2 | Status: AC

## 2023-07-01 ENCOUNTER — Other Ambulatory Visit: Payer: Self-pay

## 2023-07-01 MED ORDER — METOPROLOL SUCCINATE ER 25 MG PO TB24
12.5000 mg | ORAL_TABLET | ORAL | 0 refills | Status: DC
  Filled 2023-07-01: qty 6, 28d supply, fill #0

## 2023-08-01 ENCOUNTER — Other Ambulatory Visit: Payer: Self-pay

## 2023-08-01 MED FILL — Alendronate Sodium Tab 70 MG: ORAL | 28 days supply | Qty: 4 | Fill #3 | Status: AC

## 2023-08-03 ENCOUNTER — Encounter: Payer: Self-pay | Admitting: Physician Assistant

## 2023-08-03 ENCOUNTER — Ambulatory Visit (INDEPENDENT_AMBULATORY_CARE_PROVIDER_SITE_OTHER): Payer: 59 | Admitting: Physician Assistant

## 2023-08-03 VITALS — BP 122/79 | HR 67 | Temp 97.6°F | Ht 62.0 in | Wt 132.0 lb

## 2023-08-03 DIAGNOSIS — Z87440 Personal history of urinary (tract) infections: Secondary | ICD-10-CM | POA: Diagnosis not present

## 2023-08-03 DIAGNOSIS — N390 Urinary tract infection, site not specified: Secondary | ICD-10-CM

## 2023-08-03 DIAGNOSIS — Z09 Encounter for follow-up examination after completed treatment for conditions other than malignant neoplasm: Secondary | ICD-10-CM

## 2023-08-03 DIAGNOSIS — N3281 Overactive bladder: Secondary | ICD-10-CM | POA: Diagnosis not present

## 2023-08-03 NOTE — Progress Notes (Signed)
 08/03/2023 4:32 PM   Sheri [name] Sep 27, 1960 [id number]  CC: Chief Complaint  Patient presents with   Urinary Tract Infection   HPI: Sheri [name] is a 63 y.o. female with PMH breast cancer previously on tamoxifen , microscopic hematuria with benign workup in 2022, possible recurrent UTI, and OAB wet on Gemtesa  who presents today for evaluation of possible UTI.   Today she reports an approximate 3 to 4-day history of intermittent dysuria, pelvic discomfort, urgency, and frequency.  She started taking vitamin C and cranberry supplements and pushing fluids with symptom onset and they have largely resolved today.  Her insurance will not cover Gemtesa , but she was able to sign up for cost savings program to stay on it.  In-office UA today positive for trace leukocytes; urine microscopy pan negative.  PMH: Past Medical History:  Diagnosis Date   Arthritis    Breast cancer (HCC) 2020   right breast   Dysrhythmia    tachycardia   Family history of breast cancer    Family history of prostate cancer    GERD (gastroesophageal reflux disease)    Hypertension    Hypothyroidism    Personal history of radiation therapy    Vitamin D deficiency     Surgical History: Past Surgical History:  Procedure Laterality Date   ABDOMINAL HYSTERECTOMY     complete   BREAST BIOPSY Left 2013   negative core   BREAST BIOPSY Right 2021   INVASIVE MAMMARY CARCINOMA Grade 1. Ductal carcinoma in situ: Present, low-grade   BREAST EXCISIONAL BIOPSY Right 01/01/2020   RF tag placement [id number]   BREAST LUMPECTOMY Right 01/07/2020   CESAREAN SECTION     COLONOSCOPY WITH PROPOFOL  N/A 02/16/2022   Procedure: COLONOSCOPY WITH PROPOFOL ;  Surgeon: Unk Corinn Skiff, MD;  Location: [facility];  Service: Endoscopy;  Laterality: N/A;   INCONTINENCE SURGERY     NASAL SINUS SURGERY Left 2010   PARTIAL MASTECTOMY WITH NEEDLE LOCALIZATION AND AXILLARY SENTINEL LYMPH NODE BX Right 01/07/2020    Procedure: PARTIAL MASTECTOMY WITH RADIOFREQUENCY TAG, RIGHT AXILLARY SENTINEL LYMPH NODE BIOPSY;  Surgeon: Rodolph Romano, MD;  Location: [facility];  Service: General;  Laterality: Right;   POLYPECTOMY  02/16/2022   Procedure: POLYPECTOMY;  Surgeon: Unk Corinn Skiff, MD;  Location: [facility];  Service: Endoscopy;;   TONSILLECTOMY      Home Medications:  Allergies as of 08/03/2023       Reactions   Tape Other (See Comments)   Tears skin/redness (PAPER TAPE OK and tegaderm)_        Medication List        Accurate as of August 03, 2023  4:32 PM. If you have any questions, ask your nurse or doctor.          acetaminophen  500 MG tablet Commonly known as: TYLENOL  Take 500-1,000 mg by mouth every 6 (six) hours as needed (for pain.).   alendronate  70 MG tablet Commonly known as: FOSAMAX  Take 1 tablet (70 mg total) by mouth once a week. Take with a full glass of water  on an empty stomach.   CALCIUM 1200+D3 PO Take 1 Dose by mouth daily.   calcium carbonate 500 MG chewable tablet Commonly known as: TUMS - dosed in mg elemental calcium Chew 1-2 tablets by mouth daily as needed for indigestion or heartburn.   Cholecalciferol 25 MCG (1000 UT) tablet Take 1,000 Units by mouth 2 (two) times a week.   estradiol  0.1 MG/GM vaginal cream Commonly  known as: ESTRACE  Apply one pea-sized amount around the opening of the urethra daily for 2 weeks, then 3 times weekly moving forward.   Gemtesa  75 MG Tabs Generic drug: Vibegron  Take 1 tablet (75 mg total) by mouth daily.   Linzess  145 MCG Caps capsule Generic drug: linaclotide  Take 1 capsule (145 mcg total) by mouth daily before breakfast.   metoprolol  succinate 25 MG 24 hr tablet Commonly known as: TOPROL -XL TAKE 1/2 TABLET BY MOUTH THREE TIMES A WEEK   metoprolol  succinate 25 MG 24 hr tablet Commonly known as: TOPROL -XL Take 0.5 tablets (12.5 mg total) by mouth 3 (three) times a week.   tamoxifen  20 MG  tablet Commonly known as: NOLVADEX  Take 1 tablet (20 mg total) by mouth daily.   traZODone  50 MG tablet Commonly known as: DESYREL  Take 1 tablet (50 mg total) by mouth at bedtime.   VITAMIN B COMPLEX PO Take 1 tablet by mouth once a week.        Allergies:  Allergies  Allergen Reactions   Tape Other (See Comments)    Tears skin/redness (PAPER TAPE OK and tegaderm)_    Family History: Family History  Problem Relation Age of Onset   Breast cancer Maternal Grandmother        dx late 38s   Prostate cancer Father        dx 52s   Prostate cancer Brother        dx 75s-50s   Cancer Paternal Grandmother        tumor on ureter dx 62s    Social History:   reports that she has quit smoking. She has never used smokeless tobacco. She reports that she does not currently use alcohol. She reports that she does not use drugs.  Physical Exam: BP 122/79   Pulse 67   Temp 97.6 F (36.4 C)   Ht 5' 2 (1.575 m)   Wt 132 lb (59.9 kg)   BMI 24.14 kg/m   Constitutional:  Alert and oriented, no acute distress, nontoxic appearing HEENT: [place], AT Cardiovascular: No clubbing, cyanosis, or edema Respiratory: Normal respiratory effort, no increased work of breathing Skin: No rashes, bruises or suspicious lesions Neurologic: Grossly intact, no focal deficits, moving all 4 extremities Psychiatric: Normal mood and affect  Laboratory Data: Results for orders placed or performed in visit on 08/03/23  Microscopic Examination   Collection Time: 08/03/23  3:59 PM   Urine  Result Value Ref Range   WBC, UA 0-5 0 - 5 /hpf   RBC, Urine None seen 0 - 2 /hpf   Epithelial Cells (non renal) 0-10 0 - 10 /hpf   Bacteria, UA None seen None seen/Few  Urinalysis, Complete   Collection Time: 08/03/23  3:59 PM  Result Value Ref Range   Specific Gravity, UA 1.010 1.005 - 1.030   pH, UA 7.0 5.0 - 7.5   Color, UA Yellow Yellow   Appearance Ur Clear Clear   Leukocytes,UA Trace (A) Negative   Protein,UA  Negative Negative/Trace   Glucose, UA Negative Negative   Ketones, UA Negative Negative   RBC, UA Negative Negative   Bilirubin, UA Negative Negative   Urobilinogen, Ur 0.2 0.2 - 1.0 mg/dL   Nitrite, UA Negative Negative   Microscopic Examination See below:    Assessment & Plan:   1. Recurrent UTI (Primary) UA is bland and symptoms have resolved with pushing fluids and starting vitamin C and cranberry supplements.  Okay to follow-up as needed. - Urinalysis, Complete  Return if symptoms worsen or fail to improve.  Lucie Hones, PA-C  [facility] [place] [address] [place], [place] [address] [phone]

## 2023-08-04 LAB — URINALYSIS, COMPLETE
Bilirubin, UA: NEGATIVE
Glucose, UA: NEGATIVE
Ketones, UA: NEGATIVE
Nitrite, UA: NEGATIVE
Protein,UA: NEGATIVE
RBC, UA: NEGATIVE
Specific Gravity, UA: 1.01 (ref 1.005–1.030)
Urobilinogen, Ur: 0.2 mg/dL (ref 0.2–1.0)
pH, UA: 7 (ref 5.0–7.5)

## 2023-08-04 LAB — MICROSCOPIC EXAMINATION
Bacteria, UA: NONE SEEN
RBC, Urine: NONE SEEN /HPF (ref 0–2)

## 2023-08-26 ENCOUNTER — Inpatient Hospital Stay: Payer: 59 | Attending: Oncology | Admitting: Oncology

## 2023-08-26 ENCOUNTER — Encounter: Payer: Self-pay | Admitting: Oncology

## 2023-08-26 ENCOUNTER — Other Ambulatory Visit: Payer: Self-pay | Admitting: Oncology

## 2023-08-26 VITALS — BP 124/71 | HR 65 | Temp 95.6°F | Resp 18 | Wt 134.9 lb

## 2023-08-26 DIAGNOSIS — Z1721 Progesterone receptor positive status: Secondary | ICD-10-CM | POA: Insufficient documentation

## 2023-08-26 DIAGNOSIS — Z1231 Encounter for screening mammogram for malignant neoplasm of breast: Secondary | ICD-10-CM

## 2023-08-26 DIAGNOSIS — Z170 Estrogen receptor positive status [ER+]: Secondary | ICD-10-CM | POA: Diagnosis not present

## 2023-08-26 DIAGNOSIS — Z5181 Encounter for therapeutic drug level monitoring: Secondary | ICD-10-CM | POA: Diagnosis not present

## 2023-08-26 DIAGNOSIS — C50411 Malignant neoplasm of upper-outer quadrant of right female breast: Secondary | ICD-10-CM | POA: Diagnosis not present

## 2023-08-26 DIAGNOSIS — M818 Other osteoporosis without current pathological fracture: Secondary | ICD-10-CM | POA: Insufficient documentation

## 2023-08-26 DIAGNOSIS — Z79899 Other long term (current) drug therapy: Secondary | ICD-10-CM | POA: Diagnosis not present

## 2023-08-26 DIAGNOSIS — Z79810 Long term (current) use of selective estrogen receptor modulators (SERMs): Secondary | ICD-10-CM | POA: Diagnosis not present

## 2023-08-26 DIAGNOSIS — Z1732 Human epidermal growth factor receptor 2 negative status: Secondary | ICD-10-CM | POA: Diagnosis not present

## 2023-08-26 NOTE — Progress Notes (Signed)
Hematology/Oncology Consult note [facility]  [phone] [phone]  Patient Care Team: Danella Penton, MD as PCP - General (Internal Medicine) Creig Hines, MD as Consulting Physician (Oncology) Carolan Shiver, MD as Consulting Physician (General Surgery) Carmina Miller, MD as Referring Physician (Radiation Oncology) Jim Like, RN as Registered Nurse Chriss Driver, RN as Registered Nurse   Name of the patient: Sheri [name]  [id number]  30-Apr-1961   Date of visit: 08/26/23  Diagnosis-history of stage I breast cancer  Chief complaint/ Reason for visit-routine follow-up of breast cancer on tamoxifen  Heme/Onc history: Patient is a 63 year old female with a past medical history significant for hypertension who recently underwent a screening mammogram on 10/18/2019 which showed a possible mass in the right breast.  This was followed by a diagnostic mammogram and ultrasound which showed hypoechoic mass measuring 0.8 x 0.8 x 0.9 cm at 10 o'clock position in the right breast 7 cm from the nipple.  Right axilla demonstrates normal-appearing lymph nodes.  This was biopsied and was consistent with invasive mammary carcinoma grade 1, 8 mm ER 91 -100% positive, PR 11 to 20% positive and HER-2/neu negative.    Patient is doing well for her age and denies any complaints at this time.  Menarche at the age of 59.  She used hormone replacement therapy for a few years in the past.  G2, P2 L2.  No prior breast biopsies.  Age at first pregnancy 28 years.  Menopause in her 27s.   Final pathology showed 2 separate foci of tumor 5 mm and 8 mm in size.  No malignancy in the intervening tissue.  4 sentinel lymph nodes negative for malignancy.  Margins negative.  Tumor was overall grade 1 ER/PR positive and HER-2 negative patient completed adjuvant radiation treatment and Started Arimidex in September 2021.  She could not tolerate Arimidex due to  arthralgias and was switched to exemestane.  Patient could not tolerate that as well and was eventually switched to tamoxifen    Interval history-patient is tolerating tamoxifen well without any significant side effects.  Occasional pain at the lumpectomy site.  ECOG PS- 0 Pain scale- 0   Review of systems- Review of Systems  Constitutional:  Negative for chills, fever, malaise/fatigue and weight loss.  HENT:  Negative for congestion, ear discharge and nosebleeds.   Eyes:  Negative for blurred vision.  Respiratory:  Negative for cough, hemoptysis, sputum production, shortness of breath and wheezing.   Cardiovascular:  Negative for chest pain, palpitations, orthopnea and claudication.  Gastrointestinal:  Negative for abdominal pain, blood in stool, constipation, diarrhea, heartburn, melena, nausea and vomiting.  Genitourinary:  Negative for dysuria, flank pain, frequency, hematuria and urgency.  Musculoskeletal:  Negative for back pain, joint pain and myalgias.  Skin:  Negative for rash.  Neurological:  Negative for dizziness, tingling, focal weakness, seizures, weakness and headaches.  Endo/Heme/Allergies:  Does not bruise/bleed easily.  Psychiatric/Behavioral:  Negative for depression and suicidal ideas. The patient does not have insomnia.       Allergies  Allergen Reactions   Tape Other (See Comments)    Tears skin/redness (PAPER TAPE OK and tegaderm)_     Past Medical History:  Diagnosis Date   Arthritis    Breast cancer (HCC) 2020   right breast   Dysrhythmia    tachycardia   Family history of breast cancer    Family history of prostate cancer    GERD (gastroesophageal reflux disease)  Hypertension    Hypothyroidism    Personal history of radiation therapy    Vitamin D deficiency      Past Surgical History:  Procedure Laterality Date   ABDOMINAL HYSTERECTOMY     complete   BREAST BIOPSY Left 2013   negative core   BREAST BIOPSY Right 2021   INVASIVE  MAMMARY CARCINOMA Grade 1. Ductal carcinoma in situ: Present, low-grade   BREAST EXCISIONAL BIOPSY Right 01/01/2020   RF tag placement [id number]   BREAST LUMPECTOMY Right 01/07/2020   CESAREAN SECTION     COLONOSCOPY WITH PROPOFOL N/A 02/16/2022   Procedure: COLONOSCOPY WITH PROPOFOL;  Surgeon: Toney Reil, MD;  Location: [facility];  Service: Endoscopy;  Laterality: N/A;   INCONTINENCE SURGERY     NASAL SINUS SURGERY Left 2010   PARTIAL MASTECTOMY WITH NEEDLE LOCALIZATION AND AXILLARY SENTINEL LYMPH NODE BX Right 01/07/2020   Procedure: PARTIAL MASTECTOMY WITH RADIOFREQUENCY TAG, RIGHT AXILLARY SENTINEL LYMPH NODE BIOPSY;  Surgeon: Carolan Shiver, MD;  Location: [facility];  Service: General;  Laterality: Right;   POLYPECTOMY  02/16/2022   Procedure: POLYPECTOMY;  Surgeon: Toney Reil, MD;  Location: [facility];  Service: Endoscopy;;   TONSILLECTOMY      Social History   Socioeconomic History   Marital status: Divorced    Spouse name: Not on file   Number of children: Not on file   Years of education: Not on file   Highest education level: Not on file  Occupational History   Not on file  Tobacco Use   Smoking status: Former   Smokeless tobacco: Never   Tobacco comments:    33 years ago  Vaping Use   Vaping status: Never Used  Substance and Sexual Activity   Alcohol use: Not Currently   Drug use: Never   Sexual activity: Not on file  Other Topics Concern   Not on file  Social History Narrative   Not on file   Social Drivers of Health   Financial Resource Strain: Not on file  Food Insecurity: Not on file  Transportation Needs: Not on file  Physical Activity: Not on file  Stress: Not on file  Social Connections: Not on file  Intimate Partner Violence: Not on file    Family History  Problem Relation Age of Onset   Breast cancer Maternal Grandmother        dx late 49s   Prostate cancer Father        dx 52s   Prostate cancer  Brother        dx 1s-50s   Cancer Paternal Grandmother        tumor on ureter dx 80s     Current Outpatient Medications:    acetaminophen (TYLENOL) 500 MG tablet, Take 500-1,000 mg by mouth every 6 (six) hours as needed (for pain.)., Disp: , Rfl:    alendronate (FOSAMAX) 70 MG tablet, Take 1 tablet (70 mg total) by mouth once a week. Take with a full glass of water on an empty stomach., Disp: 4 tablet, Rfl: 5   B Complex Vitamins (VITAMIN B COMPLEX PO), Take 1 tablet by mouth once a week., Disp: , Rfl:    calcium carbonate (TUMS - DOSED IN MG ELEMENTAL CALCIUM) 500 MG chewable tablet, Chew 1-2 tablets by mouth daily as needed for indigestion or heartburn., Disp: , Rfl:    Calcium-Magnesium-Vitamin D (CALCIUM 1200+D3 PO), Take 1 Dose by mouth daily., Disp: , Rfl:    Cholecalciferol 25 MCG (  1000 UT) tablet, Take 1,000 Units by mouth 2 (two) times a week. , Disp: , Rfl:    estradiol (ESTRACE) 0.1 MG/GM vaginal cream, Apply one pea-sized amount around the opening of the urethra daily for 2 weeks, then 3 times weekly moving forward., Disp: 42.5 g, Rfl: 12   linaclotide (LINZESS) 145 MCG CAPS capsule, Take 1 capsule (145 mcg total) by mouth daily before breakfast., Disp: 30 capsule, Rfl: 0   metoprolol succinate (TOPROL-XL) 25 MG 24 hr tablet, TAKE 1/2 TABLET BY MOUTH THREE TIMES A WEEK, Disp: 36 tablet, Rfl: 0   metoprolol succinate (TOPROL-XL) 25 MG 24 hr tablet, Take 0.5 tablets (12.5 mg total) by mouth 3 (three) times a week. (Patient not taking: Reported on 08/03/2023), Disp: 36 tablet, Rfl: 0   tamoxifen (NOLVADEX) 20 MG tablet, Take 1 tablet (20 mg total) by mouth daily., Disp: 30 tablet, Rfl: 3   traZODone (DESYREL) 50 MG tablet, Take 1 tablet (50 mg total) by mouth at bedtime., Disp: 90 tablet, Rfl: 3   Vibegron (GEMTESA) 75 MG TABS, Take 1 tablet (75 mg total) by mouth daily. (Patient not taking: Reported on 08/03/2023), Disp: 30 tablet, Rfl: 11  Physical exam:  Vitals:   08/26/23 1010  BP:  124/71  Pulse: 65  Resp: 18  Temp: (!) 95.6 F (35.3 C)  TempSrc: Tympanic  SpO2: 100%  Weight: 134 lb 14.4 oz (61.2 kg)   Physical Exam Cardiovascular:     Rate and Rhythm: Normal rate and regular rhythm.     Heart sounds: Normal heart sounds.  Pulmonary:     Effort: Pulmonary effort is normal.     Breath sounds: Normal breath sounds.  Abdominal:     Palpations: Abdomen is soft.  Skin:    General: Skin is warm and dry.  Neurological:     Mental Status: She is alert and oriented to person, place, and time.    Breast exam was performed in seated and lying down position. Patient is status post right lumpectomy with a well-healed surgical scar. No evidence of any palpable masses. No evidence of axillary adenopathy. No evidence of any palpable masses or lumps in the left breast. No evidence of leftt axillary adenopathy      Latest Ref Rng & Units 03/06/2021    9:31 AM  CMP  Glucose 70 - 99 mg/dL 161   BUN 6 - 20 mg/dL 12   Creatinine 0.96 - 1.00 mg/dL 0.45   Sodium 409 - 811 mmol/L 137   Potassium 3.5 - 5.1 mmol/L 4.4   Chloride 98 - 111 mmol/L 99   CO2 22 - 32 mmol/L 29   Calcium 8.9 - 10.3 mg/dL 9.4   Total Protein 6.5 - 8.1 g/dL 7.0   Total Bilirubin 0.3 - 1.2 mg/dL 0.7   Alkaline Phos 38 - 126 U/L 81   AST 15 - 41 U/L 18   ALT 0 - 44 U/L 13       Latest Ref Rng & Units 03/06/2021    9:31 AM  CBC  WBC 4.0 - 10.5 K/uL 6.0   Hemoglobin 12.0 - 15.0 g/dL 91.4   Hematocrit 78.2 - 46.0 % 38.4   Platelets 150 - 400 K/uL 249      Assessment and plan- Patient is a 63 y.o. female with history of stage I right breast cancer status postlumpectomy and adjuvant radiation therapy currently on tamoxifen.  This is a routine follow-up visit  Patient was switched from aromatase inhibitor  to tamoxifen due to intolerance to AI.  Moreover she has baseline osteoporosis.  She will complete 5 years of endocrine therapy in September 2026.  Tolerating tamoxifen well without any  significant side effects.  No abnormal vaginal bleeding.  History of osteoporosis: On weekly FosamaxWe will plan for repeat bone density scan in August of this year.  I would like her to take Fosamax for 3 years before considering treatment break.  She started taking the drug sometime in September 2023.    Visit Diagnosis 1. Malignant neoplasm of upper-outer quadrant of right breast in female, estrogen receptor positive (HCC)   2. Other osteoporosis without current pathological fracture   3. Encounter for monitoring tamoxifen therapy      Dr. Owens Shark, MD, MPH [facility] [phone] 08/26/2023 4:02 PM

## 2023-08-31 MED FILL — Alendronate Sodium Tab 70 MG: ORAL | 28 days supply | Qty: 4 | Fill #4 | Status: AC

## 2023-09-07 ENCOUNTER — Encounter: Payer: Self-pay | Admitting: Pediatrics

## 2023-09-07 ENCOUNTER — Ambulatory Visit: Payer: 59 | Admitting: Pediatrics

## 2023-09-07 ENCOUNTER — Other Ambulatory Visit: Payer: Self-pay

## 2023-09-07 VITALS — BP 130/80 | HR 64 | Ht 63.0 in | Wt 134.6 lb

## 2023-09-07 DIAGNOSIS — Z1330 Encounter for screening examination for mental health and behavioral disorders, unspecified: Secondary | ICD-10-CM

## 2023-09-07 DIAGNOSIS — N029 Recurrent and persistent hematuria with unspecified morphologic changes: Secondary | ICD-10-CM | POA: Diagnosis not present

## 2023-09-07 DIAGNOSIS — C50911 Malignant neoplasm of unspecified site of right female breast: Secondary | ICD-10-CM

## 2023-09-07 DIAGNOSIS — M810 Age-related osteoporosis without current pathological fracture: Secondary | ICD-10-CM | POA: Diagnosis not present

## 2023-09-07 DIAGNOSIS — G4700 Insomnia, unspecified: Secondary | ICD-10-CM | POA: Diagnosis not present

## 2023-09-07 DIAGNOSIS — R350 Frequency of micturition: Secondary | ICD-10-CM

## 2023-09-07 DIAGNOSIS — Z170 Estrogen receptor positive status [ER+]: Secondary | ICD-10-CM

## 2023-09-07 DIAGNOSIS — F418 Other specified anxiety disorders: Secondary | ICD-10-CM

## 2023-09-07 DIAGNOSIS — Z7689 Persons encountering health services in other specified circumstances: Secondary | ICD-10-CM

## 2023-09-07 DIAGNOSIS — I10 Essential (primary) hypertension: Secondary | ICD-10-CM

## 2023-09-07 DIAGNOSIS — F419 Anxiety disorder, unspecified: Secondary | ICD-10-CM

## 2023-09-07 MED ORDER — HYDROXYZINE HCL 10 MG PO TABS
10.0000 mg | ORAL_TABLET | Freq: Three times a day (TID) | ORAL | 0 refills | Status: DC | PRN
  Filled 2023-09-07: qty 30, 10d supply, fill #0

## 2023-09-07 NOTE — Progress Notes (Signed)
Establish Care Note  BP 130/80 (BP Location: Left Arm, Patient Position: Sitting, Cuff Size: Normal)   Pulse 64   Ht 5\' 3"  (1.6 m)   Wt 134 lb 9.6 oz (61.1 kg)   SpO2 98%   BMI 23.84 kg/m    Subjective:    Patient ID: Sheri [name], female    DOB: 1961-05-24, 63 y.o.   MRN: [id number]  HPI: Sheri [name] is a 63 y.o. female  Chief Complaint  Patient presents with   Establish Care   RESLESS LEG     Bilateral legs    Medication Management    Would like to have Buspar instead of trazodone     Establishing care, the following was discussed today:  Discussed the use of AI scribe software for clinical note transcription with the patient, who gave verbal consent to proceed.  History of Present Illness   Sheri [name] is a 63 year old female who presents for establish care.  She is one year post-treatment for breast cancer. She had a follow-up appointment on January 31st and is scheduled for a mammogram and bone scan in April. She was previously on aromatase inhibitors but switched to tamoxifen due to joint pain, which resolved after the change.  She has a history of osteopenia and osteoporosis, identified through bone scans, and is currently taking Fosamax. She feels good with the current treatment regimen.  She is taking metoprolol, initially prescribed for elevated blood pressure and anxiety. She exercises daily, watches her diet, and limits salt intake. She takes metoprolol 25 mg, sometimes halving the dose, three times a week. She previously tried hydrochlorothiazide but experienced dizziness due to low blood pressure.  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 She has not tried Buspar or hydroxyzine for anxiety.  She has a history of microscopic hematuria over the past five years, with increasing levels each year. She has urgency and frequency of urination and was evaluated by [facility], where tests showed no blood in urine. She has undergone cystoscopy and kidney ultrasound, which were unremarkable. She uses estrogen cream for urinary symptoms, which provides comfort but does not significantly reduce frequency or urgency.  She experiences leg movements when tired and sitting, which resolve upon going to bed. She takes magnesium supplements but does not use medication for this issue.      Current Outpatient Medications on File Prior to Visit  Medication Sig Dispense Refill   acetaminophen (TYLENOL) 500 MG tablet Take 500-1,000 mg by mouth every 6 (six) hours as needed (for pain.).     alendronate (FOSAMAX) 70 MG tablet Take 1 tablet (70 mg total) by mouth once a week. Take with a full glass of water on an empty stomach. 4 tablet 5   B Complex Vitamins (VITAMIN B COMPLEX PO) Take 1 tablet by mouth once a week.     calcium carbonate (TUMS - DOSED IN MG ELEMENTAL CALCIUM) 500 MG chewable tablet Chew 1-2 tablets by mouth daily as needed for indigestion or heartburn.     Calcium-Magnesium-Vitamin D (CALCIUM 1200+D3 PO) Take 1 Dose by mouth daily.     Cholecalciferol 25 MCG (1000 UT) tablet Take 1,000 Units by mouth 2 (two) times a week.      estradiol (ESTRACE) 0.1 MG/GM vaginal cream Apply one pea-sized amount around the opening of the urethra daily for 2 weeks, then 3 times weekly moving forward. 42.5 g 12  linaclotide (LINZESS) 145 MCG CAPS capsule Take 1 capsule (145 mcg total) by mouth daily before breakfast. (Patient taking differently: Take 145 mcg by mouth as needed.) 30 capsule 0   metoprolol succinate (TOPROL-XL) 25 MG 24 hr tablet TAKE 1/2 TABLET BY MOUTH THREE TIMES A WEEK 36 tablet 0   tamoxifen (NOLVADEX) 20 MG tablet Take 1 tablet (20 mg total) by mouth daily. 30 tablet 3   traZODone (DESYREL) 50 MG tablet Take 1 tablet (50 mg total) by mouth at bedtime. 90 tablet 3   metoprolol succinate (TOPROL-XL) 25 MG 24 hr tablet Take 0.5 tablets (12.5 mg total) by  mouth 3 (three) times a week. (Patient not taking: Reported on 09/07/2023) 36 tablet 0   No current facility-administered medications on file prior to visit.    #HM Will review HM records and updated as needed.  Relevant past medical, surgical, family and social history reviewed and updated as indicated. Interim medical history since our last visit reviewed. Allergies and medications reviewed and updated.  ROS per HPI unless specifically indicated above     Objective:    BP 130/80 (BP Location: Left Arm, Patient Position: Sitting, Cuff Size: Normal)   Pulse 64   Ht 5\' 3"  (1.6 m)   Wt 134 lb 9.6 oz (61.1 kg)   SpO2 98%   BMI 23.84 kg/m   Wt Readings from Last 3 Encounters:  09/07/23 134 lb 9.6 oz (61.1 kg)  08/26/23 134 lb 14.4 oz (61.2 kg)  08/03/23 132 lb (59.9 kg)     Physical Exam Constitutional:      Appearance: Normal appearance.  HENT:     Head: Normocephalic and atraumatic.  Eyes:     Pupils: Pupils are equal, round, and reactive to light.  Cardiovascular:     Rate and Rhythm: Normal rate and regular rhythm.     Pulses: Normal pulses.     Heart sounds: Normal heart sounds.  Pulmonary:     Effort: Pulmonary effort is normal.     Breath sounds: Normal breath sounds.  Abdominal:     General: Abdomen is flat.     Palpations: Abdomen is soft.  Musculoskeletal:        General: Normal range of motion.     Cervical back: Normal range of motion.  Skin:    General: Skin is warm and dry.     Capillary Refill: Capillary refill takes less than 2 seconds.  Neurological:     General: No focal deficit present.     Mental Status: She is alert. Mental status is at baseline.  Psychiatric:        Mood and Affect: Mood normal.        Behavior: Behavior normal.         09/07/2023    3:20 PM  Depression screen PHQ 2/9  Decreased Interest 2  Down, Depressed, Hopeless 0  PHQ - 2 Score 2  Altered sleeping 2  Tired, decreased energy 0  Change in appetite 0  Feeling  bad or failure about yourself  0  Trouble concentrating 2  Moving slowly or fidgety/restless 0  Suicidal thoughts 0  PHQ-9 Score 6        09/07/2023    3:19 PM  GAD 7 : Generalized Anxiety Score  Nervous, Anxious, on Edge 2  Control/stop worrying 1  Worry too much - different things 2  Trouble relaxing 2  Restless 2  Easily annoyed or irritable 2  Afraid - awful might happen  2  Total GAD 7 Score 13       Assessment & Plan:  Assessment & Plan   Situational anxiety Assessment & Plan: Experiences anxiety in specific situations, impacting daily activities. -Start Hydroxyzine 10mg  tablets, initially at night, can increase to three times a day as needed for anxiety. -Consider referral to urogynecologist and pelvic floor physical therapy for further evaluation and treatment.  Orders: -     hydrOXYzine HCl; Take 1 tablet (10 mg total) by mouth 3 (three) times daily as needed.  Dispense: 30 tablet; Refill: 0  Urinary frequency Assessment & Plan: Longstanding issue. Has seen urologist. Interested in urogyn evaluation. -Consider referral to urogynecologist for further evaluation. Consider pelvic floor therapy.  Orders: -     Ambulatory referral to Urogynecology  Malignant neoplasm of right breast in female, estrogen receptor positive, unspecified site of breast [facility]) Assessment & Plan: One year post-treatment, currently on Tamoxifen due to intolerance of Aromatase inhibitors. Mammogram and bone scan scheduled for April. -Continue Tamoxifen as prescribed. -Follow-up after next mammogram and bone scan.   Benign hematuria Assessment & Plan: Long-standing microscopic blood in urine, previously evaluated by urology with cystoscopy and renal ultrasound showing no malignancy. CTM. Cystoscopy negative.  Essential (primary) hypertension Assessment & Plan: Previously tried hydrochlorothiazide but caused dizziness. Currently on Metoprolol 25mg  three times a week, which was intended to  treat anxiety. -Consider discontinuing Metoprolol as it may not be significantly impacting blood pressure and is not the best for anxiety. Pt will think about it  Insomnia, unspecified type Assessment & Plan: Long-term use of Trazodone 50mg  for sleep. -Continue Trazodone 50mg  as needed for sleep.   Age-related osteoporosis without current pathological fracture Assessment & Plan: Identified on previous bone scan, currently on Fosamax. -Continue Fosamax as prescribed. -Follow-up after next bone scan in April.  Encounter to establish care Reviewed available patient record including history, medications, problem list. HM updated as able. Will review and/or request outside records (if applicable) and will fill remaining HM gaps as needed at follow up visit.  Encounter for behavioral health screening As part of their intake evaluation, the patient was screened for depression, anxiety.  PHQ9 SCORE 6, GAD7 SCORE 13. Screening results positive for tested conditions. See plan under problem/diagnosis above.   Follow up plan: Return in about 2 weeks (around 09/21/2023) for Physical.  Arcadia Gorgas Howell Pringle, MD

## 2023-09-07 NOTE — Patient Instructions (Addendum)
Hydroxyzine 10mg , can use 1/2 to full tab as needed for anxiety.   Good to meet you! Welcome to [facility]!  As your primary care doctor, I look forward to working with you to help you reach your health goals.  Please be aware of a couple of logistical items: - If you message me on mychart, it may take me 1-2 business days to get back to you. This is for non-urgent messaging.  - If you require urgent clinical attention, please call the clinic or present to urgent care/emergency room - If you have labs, I typically will send a message about them in 1-2 business days. - I am not here on Mondays, otherwise will be available from Tuesday-Friday during 8a-5pm.

## 2023-09-11 ENCOUNTER — Encounter: Payer: Self-pay | Admitting: Pediatrics

## 2023-09-11 DIAGNOSIS — G4700 Insomnia, unspecified: Secondary | ICD-10-CM | POA: Insufficient documentation

## 2023-09-11 DIAGNOSIS — F418 Other specified anxiety disorders: Secondary | ICD-10-CM | POA: Insufficient documentation

## 2023-09-11 DIAGNOSIS — R3915 Urgency of urination: Secondary | ICD-10-CM | POA: Insufficient documentation

## 2023-09-11 DIAGNOSIS — M810 Age-related osteoporosis without current pathological fracture: Secondary | ICD-10-CM | POA: Insufficient documentation

## 2023-09-11 DIAGNOSIS — R350 Frequency of micturition: Secondary | ICD-10-CM | POA: Insufficient documentation

## 2023-09-11 NOTE — Assessment & Plan Note (Addendum)
Longstanding issue. Has seen urologist. Interested in urogyn evaluation. -Consider referral to urogynecologist for further evaluation. Consider pelvic floor therapy.

## 2023-09-11 NOTE — Assessment & Plan Note (Signed)
Long-term use of Trazodone 50mg  for sleep. -Continue Trazodone 50mg  as needed for sleep.

## 2023-09-11 NOTE — Assessment & Plan Note (Signed)
Experiences anxiety in specific situations, impacting daily activities. -Start Hydroxyzine 10mg  tablets, initially at night, can increase to three times a day as needed for anxiety. -Consider referral to urogynecologist and pelvic floor physical therapy for further evaluation and treatment.

## 2023-09-11 NOTE — Assessment & Plan Note (Signed)
Previously tried hydrochlorothiazide but caused dizziness. Currently on Metoprolol 25mg  three times a week, which also helps with anxiety. -Consider discontinuing Metoprolol as it may not be significantly impacting blood pressure and is not the best for anxiety.

## 2023-09-11 NOTE — Assessment & Plan Note (Signed)
Long-standing microscopic blood in urine, previously evaluated by urology with cystoscopy and renal ultrasound showing no malignancy. CTM. Cystoscopy negative.

## 2023-09-11 NOTE — Assessment & Plan Note (Signed)
One year post-treatment, currently on Tamoxifen due to intolerance of Aromatase inhibitors. Mammogram and bone scan scheduled for April. -Continue Tamoxifen as prescribed. -Follow-up after next mammogram and bone scan.

## 2023-09-11 NOTE — Assessment & Plan Note (Signed)
Identified on previous bone scan, currently on Fosamax. -Continue Fosamax as prescribed. -Follow-up after next bone scan in April.

## 2023-10-03 MED FILL — Alendronate Sodium Tab 70 MG: ORAL | 28 days supply | Qty: 4 | Fill #5 | Status: AC

## 2023-10-04 ENCOUNTER — Encounter: Payer: Self-pay | Admitting: Pediatrics

## 2023-10-04 ENCOUNTER — Other Ambulatory Visit: Payer: Self-pay

## 2023-10-04 ENCOUNTER — Ambulatory Visit (INDEPENDENT_AMBULATORY_CARE_PROVIDER_SITE_OTHER): Payer: 59 | Admitting: Pediatrics

## 2023-10-04 VITALS — BP 109/67 | HR 68 | Temp 98.9°F | Resp 16 | Ht 62.99 in | Wt 134.6 lb

## 2023-10-04 DIAGNOSIS — Z13220 Encounter for screening for lipoid disorders: Secondary | ICD-10-CM | POA: Diagnosis not present

## 2023-10-04 DIAGNOSIS — Z170 Estrogen receptor positive status [ER+]: Secondary | ICD-10-CM | POA: Diagnosis not present

## 2023-10-04 DIAGNOSIS — Z124 Encounter for screening for malignant neoplasm of cervix: Secondary | ICD-10-CM

## 2023-10-04 DIAGNOSIS — Z0000 Encounter for general adult medical examination without abnormal findings: Secondary | ICD-10-CM | POA: Diagnosis not present

## 2023-10-04 DIAGNOSIS — C50911 Malignant neoplasm of unspecified site of right female breast: Secondary | ICD-10-CM

## 2023-10-04 DIAGNOSIS — Z1330 Encounter for screening examination for mental health and behavioral disorders, unspecified: Secondary | ICD-10-CM

## 2023-10-04 DIAGNOSIS — F418 Other specified anxiety disorders: Secondary | ICD-10-CM

## 2023-10-04 DIAGNOSIS — Z131 Encounter for screening for diabetes mellitus: Secondary | ICD-10-CM | POA: Diagnosis not present

## 2023-10-04 MED ORDER — PROPRANOLOL HCL 10 MG PO TABS
10.0000 mg | ORAL_TABLET | Freq: Two times a day (BID) | ORAL | 1 refills | Status: DC | PRN
  Filled 2023-10-04: qty 45, 23d supply, fill #0

## 2023-10-04 NOTE — Progress Notes (Signed)
 BP 109/67 (BP Location: Left Arm, Patient Position: Sitting, Cuff Size: Normal)   Pulse 68   Temp 98.9 F (37.2 C) (Oral)   Resp 16   Ht 5' 2.99" (1.6 m)   Wt 134 lb 9.6 oz (61.1 kg)   SpO2 99%   BMI 23.85 kg/m    Annual Physical Exam - Female  Subjective:   CC: Annual Exam (No pap)   Sheri [name] is a 63 y.o. female patient here for a preventative health maintenance exam and has no acute complaints.  Health Habits: DIET: in general, a "healthy" diet   EXERCISE: a few times a week  DENTAL EXAM: Up to Date, Dr. Earlene Plater EYE EXAM: Due, follows at [facility]                       Relevant Gynecologic History LMP: No LMP recorded. Patient has had a hysterectomy.  PAP History:  No Cervical Cancer Screening results to display.  History abnormal PAP: n/a  Family history breast, ovarian cancer: No Domestic Violence Screen, feels safe at home: Yes  family history includes Breast cancer in her maternal grandmother; Cancer in her paternal grandmother; Prostate cancer in her brother and father.  Social History   Tobacco Use   Smoking status: Former   Smokeless tobacco: Never   Tobacco comments:    33 years ago  Vaping Use   Vaping status: Never Used  Substance Use Topics   Alcohol use: Not Currently   Drug use: Never   Social History   Social History Narrative   Not on file    Social drivers questionnaire is reviewed and is positive for : none  Depression Screening:     10/04/2023    4:47 PM 09/07/2023    3:20 PM  Depression screen PHQ 2/9  Decreased Interest 0 2  Down, Depressed, Hopeless 0 0  PHQ - 2 Score 0 2  Altered sleeping 1 2  Tired, decreased energy 0 0  Change in appetite 0 0  Feeling bad or failure about yourself  0 0  Trouble concentrating 0 2  Moving slowly or fidgety/restless 0 0  Suicidal thoughts 0 0  PHQ-9 Score 1 6       10/04/2023    4:48 PM 09/07/2023    3:19 PM  GAD 7 : Generalized Anxiety Score  Nervous, Anxious, on Edge  1 2  Control/stop worrying 0 1  Worry too much - different things 0 2  Trouble relaxing 0 2  Restless 1 2  Easily annoyed or irritable 0 2  Afraid - awful might happen 0 2  Total GAD 7 Score 2 13    Mental Health Plan: atrax prn  Self Management Goals  Goals   None     Health Maintenance Colon Cancer Screening : Up to Date Mammogram : Up to Date DXA scan : Up to Date Immunizations : up to date and documented  Review of Systems See HPI for relevant ROS.  Outpatient Medications Prior to Visit  Medication Sig Dispense Refill   acetaminophen (TYLENOL) 500 MG tablet Take 500-1,000 mg by mouth every 6 (six) hours as needed (for pain.).     alendronate (FOSAMAX) 70 MG tablet Take 1 tablet (70 mg total) by mouth once a week. Take with a full glass of water on an empty stomach. 4 tablet 5   B Complex Vitamins (VITAMIN B COMPLEX PO) Take 1 tablet by mouth once a week.  calcium carbonate (TUMS - DOSED IN MG ELEMENTAL CALCIUM) 500 MG chewable tablet Chew 1-2 tablets by mouth daily as needed for indigestion or heartburn.     Calcium-Magnesium-Vitamin D (CALCIUM 1200+D3 PO) Take 1 Dose by mouth daily.     Cholecalciferol 25 MCG (1000 UT) tablet Take 1,000 Units by mouth 2 (two) times a week.      estradiol (ESTRACE) 0.1 MG/GM vaginal cream Apply one pea-sized amount around the opening of the urethra daily for 2 weeks, then 3 times weekly moving forward. 42.5 g 12   hydrOXYzine (ATARAX) 10 MG tablet Take 1 tablet (10 mg total) by mouth 3 (three) times daily as needed. 30 tablet 0   linaclotide (LINZESS) 145 MCG CAPS capsule Take 1 capsule (145 mcg total) by mouth daily before breakfast. (Patient taking differently: Take 145 mcg by mouth as needed.) 30 capsule 0   tamoxifen (NOLVADEX) 20 MG tablet Take 1 tablet (20 mg total) by mouth daily. 30 tablet 3   traZODone (DESYREL) 50 MG tablet Take 1 tablet (50 mg total) by mouth at bedtime. 90 tablet 3   metoprolol succinate (TOPROL-XL) 25 MG 24  hr tablet TAKE 1/2 TABLET BY MOUTH THREE TIMES A WEEK 36 tablet 0   metoprolol succinate (TOPROL-XL) 25 MG 24 hr tablet Take 0.5 tablets (12.5 mg total) by mouth 3 (three) times a week. 36 tablet 0   No facility-administered medications prior to visit.     Patient Active Problem List   Diagnosis Date Noted   Situational anxiety 09/11/2023   Insomnia 09/11/2023   Urinary frequency 09/11/2023   Osteoporosis 09/11/2023   Essential (primary) hypertension 02/04/2023   Benign hematuria 11/04/2022   Malignant (primary) neoplasm, unspecified (HCC) 08/02/2022   Polyp of cecum    Cervical disc disease 01/06/2022   Malignant neoplasm of right breast in female, estrogen receptor positive (HCC) 05/05/2020   Family history of breast cancer    Family history of prostate cancer    Invasive carcinoma of breast (HCC) 12/25/2019   Benign essential hypertension 05/21/2016   B12 deficiency 09/27/2014    Objective:   Vitals:   10/04/23 1624  BP: 109/67  Pulse: 68  Temp: 98.9 F (37.2 C)  Resp: 16  Height: 5' 2.99" (1.6 m)  Weight: 134 lb 9.6 oz (61.1 kg)  SpO2: 99%  TempSrc: Oral  BMI (Calculated): 23.85    Body mass index is 23.85 kg/m.  Physical Exam Constitutional:      Appearance: Normal appearance.  HENT:     Head: Normocephalic and atraumatic.  Eyes:     Pupils: Pupils are equal, round, and reactive to light.  Cardiovascular:     Rate and Rhythm: Normal rate and regular rhythm.     Pulses: Normal pulses.     Heart sounds: Normal heart sounds.  Pulmonary:     Effort: Pulmonary effort is normal.     Breath sounds: Normal breath sounds.  Abdominal:     General: Abdomen is flat.     Palpations: Abdomen is soft.  Musculoskeletal:        General: Normal range of motion.     Cervical back: Normal range of motion.  Skin:    General: Skin is warm and dry.     Capillary Refill: Capillary refill takes less than 2 seconds.  Neurological:     General: No focal deficit present.      Mental Status: She is alert. Mental status is at baseline.  Psychiatric:  Mood and Affect: Mood normal.        Behavior: Behavior normal.     Assessment and Plan:   Annual physical exam Discussed lifestyle modifications and goals including plant based eating styles (such as: Mediterranean eating style), regular exercise (at least 150 min of moderate-intensity aerobic exercise per week, given AHA workout handout), get adequate sleep, and continue working with PCP towards meeting health goals to ensure healthy aging.  -     CBC -     Comprehensive metabolic panel  Situational anxiety Assessment & Plan: Hydroxyzine not working. Stop metop. Try propranolol prn.  Orders: -     Propranolol HCl; Take 1 tablet (10 mg total) by mouth 2 (two) times daily as needed.  Dispense: 45 tablet; Refill: 1  Encounter for behavioral health screening As part of their intake evaluation, the patient was screened for depression, anxiety.  PHQ9 SCORE 1, GAD7 SCORE 2. Screening results negative for tested conditions. See plan under problem/diagnosis above.   Diabetes mellitus screening -     Hemoglobin A1c  Lipid screening -     Lipid panel  Malignant neoplasm of right breast in female, estrogen receptor positive, unspecified site of breast [facility]) Assessment & Plan: On tamoxifen. Has topical estrogen for vaginal atrophy. On fosamax for underlying osteoporosis. Follows with onc.  This plan was discussed with the patient and questions were answered. There were no further concerns.  Follow up as indicated, or sooner should any new problems arise, if conditions worsen, or if they are otherwise concerned.   See patient instructions for additional information.  Jackolyn Confer, MD  Family Medicine      Future Appointments  Date Time Provider Department Center  11/03/2023  4:00 PM Jackolyn Confer, MD [facility]  11/17/2023  2:20 PM [facility]  11/17/2023  4:00 PM [facility] MM  GV-1 [facility]  12/22/2023  9:40 AM Loleta Chance, MD [facility]  02/27/2024 10:30 AM Creig Hines, MD [facility] None

## 2023-10-04 NOTE — Patient Instructions (Signed)
 Stop metoprolol Can use propranolol 10mg  twice daily as needed

## 2023-10-05 LAB — COMPREHENSIVE METABOLIC PANEL
ALT: 10 IU/L (ref 0–32)
AST: 17 IU/L (ref 0–40)
Albumin: 4 g/dL (ref 3.9–4.9)
Alkaline Phosphatase: 63 IU/L (ref 44–121)
BUN/Creatinine Ratio: 13 (ref 12–28)
BUN: 14 mg/dL (ref 8–27)
Bilirubin Total: <0.2 mg/dL (ref 0.0–1.2)
CO2: 26 mmol/L (ref 20–29)
Calcium: 9.3 mg/dL (ref 8.7–10.3)
Chloride: 103 mmol/L (ref 96–106)
Creatinine, Ser: 1.07 mg/dL — ABNORMAL HIGH (ref 0.57–1.00)
Globulin, Total: 1.8 g/dL (ref 1.5–4.5)
Glucose: 75 mg/dL (ref 70–99)
Potassium: 3.8 mmol/L (ref 3.5–5.2)
Sodium: 143 mmol/L (ref 134–144)
Total Protein: 5.8 g/dL — ABNORMAL LOW (ref 6.0–8.5)
eGFR: 58 mL/min/{1.73_m2} — ABNORMAL LOW (ref >59)

## 2023-10-05 LAB — CBC
Hematocrit: 35.6 % (ref 34.0–46.6)
Hemoglobin: 11.8 g/dL (ref 11.1–15.9)
MCH: 30.3 pg (ref 26.6–33.0)
MCHC: 33.1 g/dL (ref 31.5–35.7)
MCV: 91 fL (ref 79–97)
Platelets: 215 10*3/uL (ref 150–450)
RBC: 3.9 x10E6/uL (ref 3.77–5.28)
RDW: 12.7 % (ref 11.7–15.4)
WBC: 7.3 10*3/uL (ref 3.4–10.8)

## 2023-10-05 LAB — LIPID PANEL
Chol/HDL Ratio: 2.6 ratio (ref 0.0–4.4)
Cholesterol, Total: 161 mg/dL (ref 100–199)
HDL: 63 mg/dL (ref >39)
LDL Chol Calc (NIH): 82 mg/dL (ref 0–99)
Triglycerides: 83 mg/dL (ref 0–149)
VLDL Cholesterol Cal: 16 mg/dL (ref 5–40)

## 2023-10-05 LAB — HEMOGLOBIN A1C
Est. average glucose Bld gHb Est-mCnc: 105 mg/dL
Hgb A1c MFr Bld: 5.3 % (ref 4.8–5.6)

## 2023-10-12 NOTE — Assessment & Plan Note (Signed)
 On tamoxifen. Has topical estrogen for vaginal atrophy. On fosamax for underlying osteoporosis. Follows with onc.

## 2023-10-12 NOTE — Assessment & Plan Note (Signed)
 Hydroxyzine not working. Stop metop. Try propranolol prn.

## 2023-10-21 ENCOUNTER — Ambulatory Visit: Admitting: Pediatrics

## 2023-11-03 ENCOUNTER — Other Ambulatory Visit: Payer: Self-pay

## 2023-11-03 ENCOUNTER — Ambulatory Visit: Attending: Pediatrics

## 2023-11-03 ENCOUNTER — Encounter: Payer: Self-pay | Admitting: Pediatrics

## 2023-11-03 ENCOUNTER — Ambulatory Visit: Admitting: Pediatrics

## 2023-11-03 VITALS — BP 107/69 | HR 70 | Temp 97.9°F | Wt 135.8 lb

## 2023-11-03 DIAGNOSIS — R002 Palpitations: Secondary | ICD-10-CM | POA: Diagnosis not present

## 2023-11-03 DIAGNOSIS — R7989 Other specified abnormal findings of blood chemistry: Secondary | ICD-10-CM

## 2023-11-03 DIAGNOSIS — F418 Other specified anxiety disorders: Secondary | ICD-10-CM

## 2023-11-03 MED ORDER — PROPRANOLOL HCL 10 MG PO TABS
10.0000 mg | ORAL_TABLET | Freq: Every day | ORAL | 3 refills | Status: AC | PRN
  Filled 2023-11-03: qty 90, 90d supply, fill #0
  Filled 2024-02-13: qty 90, 90d supply, fill #1
  Filled 2024-03-27 – 2024-03-28 (×2): qty 90, 90d supply, fill #2
  Filled 2024-06-26: qty 90, 90d supply, fill #3

## 2023-11-03 NOTE — Patient Instructions (Signed)
 Continue current regimen with propranolol, I sent refills.  I sent a holter monitor that should come to your house and then once I get those results we can schedule follow up.

## 2023-11-03 NOTE — Progress Notes (Signed)
 Office Visit  BP 107/69   Pulse 70   Temp 97.9 F (36.6 C) (Oral)   Wt 135 lb 12.8 oz (61.6 kg)   SpO2 97%   BMI 24.06 kg/m    Subjective:    Patient ID: Sheri [name], female    DOB: 1961/01/05, 63 y.o.   MRN: [id number]  HPI: Sheri [name] is a 63 y.o. female  Chief Complaint  Patient presents with   Hypertension    #HTN No chest pain, sob, palpations, orthopnea, dyspnea, PND, lower extremity edema. Asymptomatic and well controlled off metop  #ANXIETY Switched to propranolol from atarax Working well for her She does have intermittent fluttering at least 1-2x/week  Relevant past medical, surgical, family and social history reviewed and updated as indicated. Interim medical history since our last visit reviewed. Allergies and medications reviewed and updated.  ROS per HPI unless specifically indicated above     Objective:    BP 107/69   Pulse 70   Temp 97.9 F (36.6 C) (Oral)   Wt 135 lb 12.8 oz (61.6 kg)   SpO2 97%   BMI 24.06 kg/m   Wt Readings from Last 3 Encounters:  11/03/23 135 lb 12.8 oz (61.6 kg)  10/04/23 134 lb 9.6 oz (61.1 kg)  09/07/23 134 lb 9.6 oz (61.1 kg)     Physical Exam Constitutional:      Appearance: Normal appearance.  HENT:     Head: Normocephalic and atraumatic.  Eyes:     Pupils: Pupils are equal, round, and reactive to light.  Cardiovascular:     Rate and Rhythm: Normal rate and regular rhythm.     Pulses: Normal pulses.     Heart sounds: Normal heart sounds.  Pulmonary:     Effort: Pulmonary effort is normal.     Breath sounds: Normal breath sounds.  Musculoskeletal:        General: Normal range of motion.     Cervical back: Normal range of motion.  Skin:    General: Skin is warm and dry.     Capillary Refill: Capillary refill takes less than 2 seconds.  Neurological:     General: No focal deficit present.     Mental Status: She is alert. Mental status is at baseline.  Psychiatric:        Mood and Affect:  Mood normal.        Behavior: Behavior normal.         11/03/2023    4:04 PM 10/04/2023    4:47 PM 09/07/2023    3:20 PM  Depression screen PHQ 2/9  Decreased Interest 0 0 2  Down, Depressed, Hopeless 1 0 0  PHQ - 2 Score 1 0 2  Altered sleeping 0 1 2  Tired, decreased energy 0 0 0  Change in appetite 0 0 0  Feeling bad or failure about yourself  0 0 0  Trouble concentrating 0 0 2  Moving slowly or fidgety/restless 0 0 0  Suicidal thoughts 0 0 0  PHQ-9 Score 1 1 6   Difficult doing work/chores Not difficult at all         11/03/2023    4:04 PM 10/04/2023    4:48 PM 09/07/2023    3:19 PM  GAD 7 : Generalized Anxiety Score  Nervous, Anxious, on Edge 1 1 2   Control/stop worrying 0 0 1  Worry too much - different things 0 0 2  Trouble relaxing 0 0 2  Restless 0 1  2  Easily annoyed or irritable 0 0 2  Afraid - awful might happen 0 0 2  Total GAD 7 Score 1 2 13   Anxiety Difficulty Not difficult at all         Assessment & Plan:  Assessment & Plan   Elevated serum creatinine Noted on previous blood work. Plan to repeat below. -     Basic metabolic panel with GFR  Situational anxiety Switched to propranolol and working well. H/o ?SVT off metop now. Continue below. -     Propranolol HCl; Take 1 tablet (10 mg total) by mouth daily as needed.  Dispense: 90 tablet; Refill: 3  Palpitations Given persistent symptoms of palpitations that are questionably related to anxiety as above, home holter reasonable. Unrelated to recent metop stop and was having even before that. VS and exam stable here. -     LONG TERM MONITOR (3-14 DAYS); Future  Follow up plan: Return if symptoms worsen or fail to improve.  Hadassah Letters, MD

## 2023-11-04 LAB — BASIC METABOLIC PANEL WITH GFR
BUN/Creatinine Ratio: 17 (ref 12–28)
BUN: 14 mg/dL (ref 8–27)
CO2: 27 mmol/L (ref 20–29)
Calcium: 8.9 mg/dL (ref 8.7–10.3)
Chloride: 100 mmol/L (ref 96–106)
Creatinine, Ser: 0.82 mg/dL (ref 0.57–1.00)
Glucose: 83 mg/dL (ref 70–99)
Potassium: 4 mmol/L (ref 3.5–5.2)
Sodium: 141 mmol/L (ref 134–144)
eGFR: 80 mL/min/{1.73_m2} (ref >59)

## 2023-11-08 ENCOUNTER — Other Ambulatory Visit: Payer: Self-pay

## 2023-11-08 ENCOUNTER — Other Ambulatory Visit: Payer: Self-pay | Admitting: Oncology

## 2023-11-09 ENCOUNTER — Other Ambulatory Visit: Payer: Self-pay

## 2023-11-09 MED FILL — Alendronate Sodium Tab 70 MG: ORAL | 28 days supply | Qty: 4 | Fill #0 | Status: AC

## 2023-11-10 ENCOUNTER — Other Ambulatory Visit: Payer: Self-pay

## 2023-11-10 ENCOUNTER — Encounter: Payer: Self-pay | Admitting: Pediatrics

## 2023-11-17 ENCOUNTER — Ambulatory Visit
Admission: RE | Admit: 2023-11-17 | Discharge: 2023-11-17 | Disposition: A | Discharge status: Home / Self Care | Payer: 59 | Source: Ambulatory Visit | Attending: Oncology | Admitting: Oncology

## 2023-11-17 DIAGNOSIS — Z1231 Encounter for screening mammogram for malignant neoplasm of breast: Secondary | ICD-10-CM | POA: Diagnosis not present

## 2023-11-17 DIAGNOSIS — Z170 Estrogen receptor positive status [ER+]: Secondary | ICD-10-CM | POA: Insufficient documentation

## 2023-11-17 DIAGNOSIS — M810 Age-related osteoporosis without current pathological fracture: Secondary | ICD-10-CM | POA: Diagnosis not present

## 2023-11-17 DIAGNOSIS — M818 Other osteoporosis without current pathological fracture: Secondary | ICD-10-CM

## 2023-11-17 DIAGNOSIS — C50411 Malignant neoplasm of upper-outer quadrant of right female breast: Secondary | ICD-10-CM | POA: Diagnosis not present

## 2023-11-17 DIAGNOSIS — Z780 Asymptomatic menopausal state: Secondary | ICD-10-CM | POA: Diagnosis not present

## 2023-11-18 ENCOUNTER — Telehealth: Payer: Self-pay

## 2023-11-18 DIAGNOSIS — R002 Palpitations: Secondary | ICD-10-CM | POA: Diagnosis not present

## 2023-11-18 NOTE — Telephone Encounter (Signed)
 Per Dr. Randy Buttery "Please let her know- her bone density scan is stable as compared to prior".  Outbound call to patient; voice message left on [phone].  Will try calling back again later.  If patient returns call please inform of above.

## 2023-11-21 DIAGNOSIS — R002 Palpitations: Secondary | ICD-10-CM

## 2023-11-21 NOTE — Telephone Encounter (Signed)
 Outbound call to patient; informed of below.  Patient inquired about mammogram results; informed results have not finalized as of yet.  Patient has no questions / concerns at this time.

## 2023-11-25 ENCOUNTER — Other Ambulatory Visit: Payer: Self-pay | Admitting: Pediatrics

## 2023-11-25 DIAGNOSIS — R002 Palpitations: Secondary | ICD-10-CM

## 2023-11-25 NOTE — Progress Notes (Signed)
 Requesting cardiology consult for palpations. On propranolol  for her anxiety. Previously on metop for HTN which was discontinued due to inconsistent use (would take intermittently).  Holter results: HR 54 - 176, average 72 bpm. 16 nonsustained SVT, longest 15 beats. Rare supraventricular and ventricular ectopy. No atrial fibrillation. Symptom activations correspond to sinus rhythm +/- PAC/PVC/AT  Sheri Letters, MD

## 2023-12-06 ENCOUNTER — Other Ambulatory Visit: Payer: Self-pay

## 2023-12-06 MED ORDER — TRAZODONE HCL 50 MG PO TABS
50.0000 mg | ORAL_TABLET | Freq: Every day | ORAL | 3 refills | Status: DC
  Filled 2023-12-06: qty 90, 90d supply, fill #0

## 2023-12-06 MED FILL — Alendronate Sodium Tab 70 MG: ORAL | 28 days supply | Qty: 4 | Fill #1 | Status: AC

## 2023-12-21 NOTE — Progress Notes (Unsigned)
 New Patient Evaluation and Consultation  Referring Provider: Hadassah Letters, MD PCP: Hadassah Letters, MD Date of Service: 12/22/2023  SUBJECTIVE Chief Complaint: No chief complaint on file.  History of Present Illness: Sheri [name] is a 63 y.o. White or Caucasian female seen in consultation at the request of Dr Juliette Oh for evaluation of urinary frequency.    Started on Gemtesa  by [facility] urology for OAB and rUTI, last seen 08/03/23 Vaginal estrogen 3x/week with peasize at urethral opening for recurrent UTI, using Vit C and cranberry extract with prior use of macrobid  100mg  daily for suppression.  ***Review of records significant for: ***microscopic hematuria evaluated by [facility] urology with negative cystoscopy and renal ultrasound in 2022, stage IA breast cancer s/p radiation on Tamoxifen   Urinary Symptoms: {urine leakage?:24754} Leaks *** time(s) per {days/wks/mos/yrs:310907}.  Pad use: {NUMBERS 1-10:18281} {pad option:24752} per day.   Patient {ACTION; IS/IS WUJ:81191478} bothered by UI symptoms.  Day time voids ***.  Nocturia: *** times per night to void with insomnia Voiding dysfunction:  {empties:24755} bladder well.  Patient {DOES NOT does:27190::"does not"} use a catheter to empty bladder.  When urinating, patient feels {urine symptoms:24756} Drinks: *** per day  UTIs: {NUMBERS 1-10:18281} UTI's in the last year.   {ACTIONS;DENIES/REPORTS:21021675::"Denies"} history of {urologic concerns:24757} No results found for the last 90 days.   Pelvic Organ Prolapse Symptoms:                  Patient {denies/ admits to:24761} a feeling of a bulge the vaginal area. It has been present for {NUMBER 1-10:22536} {days/wks/mos/yrs:310907}.  Patient {denies/ admits to:24761} seeing a bulge.  This bulge {ACTION; IS/IS GNF:62130865} bothersome.  Bowel Symptom: Bowel movements: *** time(s) per {Time; day/week/month:13537} Stool consistency: {stool consistency:24758} Straining:  {yes/no:19897}.  Splinting: {yes/no:19897}.  Incomplete evacuation: {yes/no:19897}.  Patient {denies/ admits to:24761} accidental bowel leakage / fecal incontinence  Occurs: *** time(s) per {Time; day/week/month:13537}  Consistency with leakage: {stool consistency:24758} Bowel regimen: {bowel regimen:24759} Linzess  as needed Last colonoscopy: Date ***, Results *** [facility] Colonoscopy          Upcoming     Colonoscopy (Every 7 Years) Next due on 02/16/2029    02/16/2022  Surgical Procedure: COLONOSCOPY WITH PROPOFOL    Only the first 1 history entries have been loaded, but more history exists.                Sexual Function Sexually active: {yes/no:19897}.  Sexual orientation: {Sexual Orientation:[phone]} Pain with sex: {pain with sex:24762}  Pelvic Pain {denies/ admits to:24761} pelvic pain Location: *** Pain occurs: *** Prior pain treatment: *** Improved by: *** Worsened by: ***   Past Medical History:  Past Medical History:  Diagnosis Date   Arthritis    Breast cancer (HCC) 2020   right breast   Dysrhythmia    tachycardia   Family history of breast cancer    Family history of prostate cancer    GERD (gastroesophageal reflux disease)    Hypertension    Hypothyroidism    Personal history of radiation therapy    Vitamin D deficiency      Past Surgical History:   Past Surgical History:  Procedure Laterality Date   ABDOMINAL HYSTERECTOMY     complete   BREAST BIOPSY Left 2013   negative core   BREAST BIOPSY Right 2021   INVASIVE MAMMARY CARCINOMA Grade 1. Ductal carcinoma in situ: Present, low-grade   BREAST EXCISIONAL BIOPSY Right 01/01/2020   RF tag placement [id number]   BREAST LUMPECTOMY Right 01/07/2020  CESAREAN SECTION     COLONOSCOPY WITH PROPOFOL  N/A 02/16/2022   Procedure: COLONOSCOPY WITH PROPOFOL ;  Surgeon: Selena Daily, MD;  Location: [facility];  Service: Endoscopy;  Laterality: N/A;   INCONTINENCE SURGERY     NASAL SINUS  SURGERY Left 2010   PARTIAL MASTECTOMY WITH NEEDLE LOCALIZATION AND AXILLARY SENTINEL LYMPH NODE BX Right 01/07/2020   Procedure: PARTIAL MASTECTOMY WITH RADIOFREQUENCY TAG, RIGHT AXILLARY SENTINEL LYMPH NODE BIOPSY;  Surgeon: Eldred Grego, MD;  Location: [facility];  Service: General;  Laterality: Right;   POLYPECTOMY  02/16/2022   Procedure: POLYPECTOMY;  Surgeon: Selena Daily, MD;  Location: [facility];  Service: Endoscopy;;   TONSILLECTOMY       Past OB/GYN History: OB History  No obstetric history on file.    Vaginal deliveries: ***,  Forceps/ Vacuum deliveries: ***, Cesarean section: *** Menopausal: {menopausal:24763} Contraception: ***. Last pap smear was ***.  Any history of abnormal pap smears: {yes/no:19897}. No results found for: "DIAGPAP", "HPVHIGH", "ADEQPAP"  Medications: Patient has a current medication list which includes the following prescription(s): acetaminophen , alendronate , b complex vitamins, calcium carbonate, calcium-magnesium-vitamin d, cholecalciferol, estradiol , linaclotide , propranolol , tamoxifen , trazodone , and trazodone .   Allergies: Patient is allergic to tape.   Social History:  Social History   Tobacco Use   Smoking status: Former   Smokeless tobacco: Never   Tobacco comments:    33 years ago  Vaping Use   Vaping status: Never Used  Substance Use Topics   Alcohol use: Not Currently   Drug use: Never    Relationship status: {relationship status:24764} Patient lives with ***.   Patient {ACTION; IS/IS WUJ:81191478} employed ***. Regular exercise: {Yes/No:304960894} History of abuse: {Yes/No:304960894}  Family History:   Family History  Problem Relation Age of Onset   Breast cancer Maternal Grandmother        dx late 51s   Prostate cancer Father        dx 44s   Prostate cancer Brother        dx 18s-50s   Cancer Paternal Grandmother        tumor on ureter dx 80s     Review of Systems:  ROS   OBJECTIVE Physical Exam: There were no vitals filed for this visit.  Physical Exam   GU / Detailed Urogynecologic Evaluation:  Pelvic Exam: Normal external female genitalia; Bartholin's and Skene's glands normal in appearance; urethral meatus normal in appearance, no urethral masses or discharge.   CST: {gen negative/positive:315881}  Reflexes: bulbocavernosis {DESC; PRESENT/NOT PRESENT:21021351}, anocutaneous {DESC; PRESENT/NOT PRESENT:21021351} ***bilaterally.  Speculum exam reveals normal vaginal mucosa {With/Without:20273} atrophy. Cervix {exam; gyn cervix:30847}. Uterus {exam; pelvic uterus:30849}. Adnexa {exam; adnexa:12223}.    s/p hysterectomy: Speculum exam reveals normal vaginal mucosa {With/Without:20273}  atrophy and normal vaginal cuff.  Adnexa {exam; adnexa:12223}.    With apex supported, anterior compartment defect was {reduced:24765}  Pelvic floor strength {Roman # I-V:19040}/V, puborectalis {Roman # I-V:19040}/V external anal sphincter {Roman # I-V:19040}/V  Pelvic floor musculature: Right levator {Tender/Non-tender:20250}, Right obturator {Tender/Non-tender:20250}, Left levator {Tender/Non-tender:20250}, Left obturator {Tender/Non-tender:20250}  POP-Q:   POP-Q                                               Aa  Ba                                                 C                                                Gh                                               Pb                                               tvl                                                Ap                                               Bp                                                 D      Rectal Exam:  Normal sphincter tone, {rectocele:24766} distal rectocele, enterocoele {DESC; PRESENT/NOT PRESENT:21021351}, no rectal masses, {sign of:24767} dyssynergia when asking the patient to bear down.  Post-Void Residual (PVR) by  Bladder Scan: In order to evaluate bladder emptying, we discussed obtaining a postvoid residual and patient agreed to this procedure.  Procedure: The ultrasound unit was placed on the patient's abdomen in the suprapubic region after the patient had voided.      Laboratory Results: Lab Results  Component Value Date   COLORU Yellow 08/03/2023   BILIRUBINUR Negative 08/03/2023   KETONESU Negative 08/03/2023   SPECGRAV 1.010 08/03/2023   PHUR 7.0 08/03/2023   PROTEINUR Negative 08/03/2023   LEUKOCYTESUR Trace (A) 08/03/2023    Lab Results  Component Value Date   CREATININE 0.82 11/03/2023   CREATININE 1.07 (H) 10/04/2023   CREATININE 0.90 03/06/2021    Lab Results  Component Value Date   HGBA1C 5.3 10/04/2023    Lab Results  Component Value Date   HGB 11.8 10/04/2023     ASSESSMENT AND PLAN Ms. Farabaugh is a 63 y.o. with: No diagnosis found.  There are no diagnoses linked to this encounter.   Darlene Ehlers, MD

## 2023-12-22 ENCOUNTER — Ambulatory Visit (INDEPENDENT_AMBULATORY_CARE_PROVIDER_SITE_OTHER): Payer: 59 | Admitting: Obstetrics

## 2023-12-22 ENCOUNTER — Encounter: Payer: Self-pay | Admitting: Obstetrics

## 2023-12-22 ENCOUNTER — Other Ambulatory Visit (HOSPITAL_COMMUNITY)
Admission: RE | Admit: 2023-12-22 | Discharge: 2023-12-22 | Disposition: A | Discharge status: Home / Self Care | Attending: Obstetrics | Admitting: Obstetrics

## 2023-12-22 ENCOUNTER — Other Ambulatory Visit: Payer: Self-pay

## 2023-12-22 VITALS — BP 130/82 | HR 58 | Ht 62.21 in | Wt 131.2 lb

## 2023-12-22 DIAGNOSIS — Z9889 Other specified postprocedural states: Secondary | ICD-10-CM | POA: Diagnosis not present

## 2023-12-22 DIAGNOSIS — R350 Frequency of micturition: Secondary | ICD-10-CM | POA: Insufficient documentation

## 2023-12-22 DIAGNOSIS — R351 Nocturia: Secondary | ICD-10-CM | POA: Diagnosis not present

## 2023-12-22 DIAGNOSIS — N029 Recurrent and persistent hematuria with unspecified morphologic changes: Secondary | ICD-10-CM

## 2023-12-22 DIAGNOSIS — K5900 Constipation, unspecified: Secondary | ICD-10-CM | POA: Insufficient documentation

## 2023-12-22 DIAGNOSIS — N941 Unspecified dyspareunia: Secondary | ICD-10-CM | POA: Insufficient documentation

## 2023-12-22 LAB — POCT URINALYSIS DIP (CLINITEK)
Bilirubin, UA: NEGATIVE
Bilirubin, UA: NEGATIVE
Glucose, UA: NEGATIVE mg/dL
Glucose, UA: NEGATIVE mg/dL
Ketones, POC UA: NEGATIVE mg/dL
Ketones, POC UA: NEGATIVE mg/dL
Leukocytes, UA: NEGATIVE
Nitrite, UA: NEGATIVE
Nitrite, UA: NEGATIVE
POC PROTEIN,UA: NEGATIVE
POC PROTEIN,UA: NEGATIVE
Spec Grav, UA: 1.015 (ref 1.010–1.025)
Spec Grav, UA: 1.01 (ref 1.010–1.025)
Urobilinogen, UA: 0.2 U/dL
Urobilinogen, UA: 0.2 U/dL
pH, UA: 5.5 (ref 5.0–8.0)
pH, UA: 6.5 (ref 5.0–8.0)

## 2023-12-22 LAB — URINALYSIS, ROUTINE W REFLEX MICROSCOPIC
Bilirubin Urine: NEGATIVE
Glucose, UA: NEGATIVE mg/dL
Hgb urine dipstick: NEGATIVE
Ketones, ur: NEGATIVE mg/dL
Leukocytes,Ua: NEGATIVE
Nitrite: NEGATIVE
Protein, ur: NEGATIVE mg/dL
Specific Gravity, Urine: 1.004 — ABNORMAL LOW (ref 1.005–1.030)
pH: 7 (ref 5.0–8.0)

## 2023-12-22 MED ORDER — TROSPIUM CHLORIDE 20 MG PO TABS
20.0000 mg | ORAL_TABLET | Freq: Two times a day (BID) | ORAL | 2 refills | Status: AC
  Filled 2023-12-22: qty 60, 30d supply, fill #0
  Filled 2024-05-18: qty 60, 30d supply, fill #1

## 2023-12-22 MED ORDER — LIDOCAINE 5 % EX OINT
TOPICAL_OINTMENT | CUTANEOUS | 0 refills | Status: AC
  Filled 2023-12-22: qty 35.44, 30d supply, fill #0

## 2023-12-22 MED ORDER — TROSPIUM CHLORIDE ER 60 MG PO CP24
1.0000 | ORAL_CAPSULE | Freq: Every day | ORAL | 2 refills | Status: DC
  Filled 2023-12-22: qty 30, 30d supply, fill #0

## 2023-12-22 NOTE — Assessment & Plan Note (Signed)
-   Bristol I to III with straining, incomplete emptying - Linzess  PRN, prior use of miralax and fiber supplementation with relief that subsides after 2 weeks - For constipation, we reviewed the importance of a better bowel regimen.  We also discussed the importance of avoiding chronic straining, as it can exacerbate her pelvic floor symptoms; we discussed treating constipation and straining prior to surgery, as postoperative straining can lead to damage to the repair and recurrence of symptoms. We discussed initiating therapy with increasing fluid intake, fiber supplementation, stool softeners, and laxatives such as miralax.  - referral to pelvic floor PT due to pelvic floor myofascial pain and high tone pelvic floor on exam - encouraged titration of fiber supplementation or miralax to improve stool consistency - encouraged squatting for defecation

## 2023-12-22 NOTE — Assessment & Plan Note (Signed)
-   avoid fluid intake 3 hours before bedtime - trial of trospium after repeat PVR < 150

## 2023-12-22 NOTE — Assessment & Plan Note (Addendum)
-   POCT + leuk/heme, pending UA microscopy with catheterized sample - negative workup with cystoscopy in 2022 by Dr. Ace Holder and renal ultrasound with 1.2cm upper pole left renal cyst - For management of microscopic hematuria, we discussed the importance of repeating work-up including assessing the upper and lower GU tract with CT urogram and cystoscopy if abnormal UA microscopy to r/o foreign material in bladder  - Cr 0.82 in 11/03/23  - 5 pack year -  denies pelvic radiation, history of chemotherapy, family history of kidney cancer (paternal grandmother with growth on ureter that required stent placement), or exposure to chemicals at work

## 2023-12-22 NOTE — Assessment & Plan Note (Addendum)
-   s/p TVH, BSO bladder tack with mesh in 2008/2009 by Dr. Alban Alm  due to vaginal bulge at opening and stress urinary leakage - pain with palpation of anterior vaginal wall with pelvic floor myofascial pain - office to schedule urodynamics, denies testing since pelvic surgery - negative cystoscopy in 2022 by Dr. Ace Holder (urology), discussed possible need to repeat if persistent symptoms

## 2023-12-22 NOTE — Assessment & Plan Note (Addendum)
-   h/o vaginal mesh surgery for prolapse and stress urinary incontinence  - catheterized for , office to schedule urodynamic to r/o urinary retention or excessive tension from vaginal mesh due to pain with catheterization and exam of anterior vaginal wall - We discussed the symptoms of overactive bladder (OAB), which include urinary urgency, urinary frequency, nocturia, with or without urge incontinence.  While we do not know the exact etiology of OAB, several treatment options exist. We discussed management including behavioral therapy (decreasing bladder irritants, urge suppression strategies, timed voids, bladder retraining), physical therapy, medication; for refractory cases posterior tibial nerve stimulation, sacral neuromodulation, and intravesical botulinum toxin injection.  For anticholinergic medications, we discussed the potential side effects of anticholinergics including dry eyes, dry mouth, constipation, cognitive impairment and urinary retention. For Beta-3 agonist medication, we discussed the potential side effect of elevated blood pressure which is more likely to occur in individuals with uncontrolled hypertension. - denies relief after Gemtesa  x 2 weeks and cost prohibitive - prior use of oxybutynin  with relief, stopped due to risk of dementia - Rx trospium, discussed BID vs. Daily dosing with cost differential. Per pharmacy, 60mg  not covered by insurance. Discussed costplus and pricing if cost prohibitive per insurance. Start after urodynamics to ensure complete bladder emptying.

## 2023-12-22 NOTE — Assessment & Plan Note (Addendum)
-   pain with insertion - reports perineorrhaphy with episiotomy scar revision at the time of vaginal mesh surgery for prolapse and stress urinary incontinence - pain with palpation of levator ani bilaterally and pain along anterior vaginal wall without mesh exposure by palpation or visualization. Possible redundant mesh from midurethral region to bladder neck by palpation. Pain with insertion of catheter. - The origin of pelvic floor muscle spasm can be multifactorial, including primary, reactive to a different pain source, trauma, or even part of a centralized pain syndrome.Treatment options include pelvic floor physical therapy, local (vaginal) or oral  muscle relaxants, pelvic muscle trigger point injections or centrally acting pain medications.   - discussed concerns of pain in the setting of prior vaginal mesh placement, discussed risk of vaginal mesh excisions including infection, bleeding, persistent pain, return of prolapse or incontinence.  - increase low dose vaginal estrogen 0.5-1g twice a week in the vagina - referral to pelvic floor PT due to pelvic floor myofascial pain - Rx topical lidocaine  PRN discomfort

## 2023-12-22 NOTE — Patient Instructions (Addendum)
 We discussed the symptoms of overactive bladder (OAB), which include urinary urgency, urinary frequency, night-time urination, with or without urge incontinence.  We discussed management including behavioral therapy (decreasing bladder irritants by following a bladder diet, urge suppression strategies, timed voids, bladder retraining), physical therapy, medication; and for refractory cases posterior tibial nerve stimulation, sacral neuromodulation, and intravesical botulinum toxin injection.   For anticholinergic medications, we discussed the potential side effects of anticholinergics including dry eyes, dry mouth, constipation, rare risks of cognitive impairment and urinary retention. You were given prescription for Trospium 30mg  daily.  It can take a month to start working so give it time, but if you have bothersome side effects call sooner and we can try a different medication.  Call us  if you have trouble filling the prescription or if it's not covered by your insurance. Do not start until after urodynamic testing  For night time frequency: - avoid fluid intake 3 hours before bedtime  For vaginal atrophy (thinning of the vaginal tissue that can cause dryness and burning) and UTI prevention we discussed estrogen replacement in the form of vaginal cream.   Start vaginal estrogen therapy 0.5-1g nightly for two weeks then 2 times weekly at night. This can be placed with your finger or an applicator inside the vagina and around the urethra.  Please let us  know if the prescription is too expensive and we can look for alternative options.   Is vaginal estrogen therapy safe for me? Vaginal estrogen preparations act on the vaginal skin, and only a very tiny amount is absorbed into the bloodstream (0.01%).  They work in a similar way to hand or face cream.  There is minimal absorption and they are therefore perfectly safe. If you have had breast cancer and have persistent troublesome symptoms which aren't  settling with vaginal moisturisers and lubricants, local estrogen treatment may be a possibility, but consultation with your oncologist should take place first.   Constipation: Our goal is to achieve formed bowel movements daily or every-other-day.  You may need to try different combinations of the following options to find what works best for you - everybody's body works differently so feel free to adjust the dosages as needed.  Some options to help maintain bowel health include:  Dietary changes (more leafy greens, vegetables and fruits; less processed foods) Fiber supplementation (Benefiber, FiberCon, Metamucil or Psyllium). Start slow and increase gradually to full dose. Over-the-counter agents such as: stool softeners (Docusate or Colace) and/or laxatives (Miralax, milk of magnesia)  "Power Pudding" is a natural mixture that may help your constipation.  To make blend 1 cup applesauce, 1 cup wheat bran, and 3/4 cup prune juice, refrigerate and then take 1 tablespoon daily with a large glass of water  as needed.  Women should try to eat at least 21 to 25 grams of fiber a day, while men should aim for 30 to 38 grams a day. You can add fiber to your diet with food or a fiber supplement such as psyllium (metamucil), benefiber, or fibercon.   Here's a look at how much dietary fiber is found in some common foods. When buying packaged foods, check the Nutrition Facts label for fiber content. It can vary among brands.  Fruits Serving size Total fiber (grams)*  Raspberries 1 cup 8.0  Pear 1 medium 5.5  Apple, with skin 1 medium 4.5  Banana 1 medium 3.0  Orange 1 medium 3.0  Strawberries 1 cup 3.0   Vegetables Serving size Total fiber (grams)*  Green peas, boiled 1 cup 9.0  Broccoli, boiled 1 cup chopped 5.0  Turnip greens, boiled 1 cup 5.0  Brussels sprouts, boiled 1 cup 4.0  Potato, with skin, baked 1 medium 4.0  Sweet corn, boiled 1 cup 3.5  Cauliflower, raw 1 cup chopped 2.0  Carrot, raw 1  medium 1.5   Grains Serving size Total fiber (grams)*  Spaghetti, whole-wheat, cooked 1 cup 6.0  Barley, pearled, cooked 1 cup 6.0  Bran flakes 3/4 cup 5.5  Quinoa, cooked 1 cup 5.0  Oat bran muffin 1 medium 5.0  Oatmeal, instant, cooked 1 cup 5.0  Popcorn, air-popped 3 cups 3.5  Brown rice, cooked 1 cup 3.5  Bread, whole-wheat 1 slice 2.0  Bread, rye 1 slice 2.0   Legumes, nuts and seeds Serving size Total fiber (grams)*  Split peas, boiled 1 cup 16.0  Lentils, boiled 1 cup 15.5  Black beans, boiled 1 cup 15.0  Baked beans, canned 1 cup 10.0  Chia seeds 1 ounce 10.0  Almonds 1 ounce (23 nuts) 3.5  Pistachios 1 ounce (49 nuts) 3.0  Sunflower kernels 1 ounce 3.0  *Rounded to nearest 0.5 gram. Source: [employer] for Standard Reference, Legacy Release    The origin of pelvic floor muscle spasm can be multifactorial, including primary, reactive to a different pain source, trauma, or even part of a centralized pain syndrome.Treatment options include pelvic floor physical therapy, local (vaginal) or oral  muscle relaxants, pelvic muscle trigger point injections or centrally acting pain medications.     Please call [phone] to schedule the earliest appointment for pelvic floor PT.

## 2024-01-05 ENCOUNTER — Other Ambulatory Visit: Payer: Self-pay

## 2024-01-05 DIAGNOSIS — Q828 Other specified congenital malformations of skin: Secondary | ICD-10-CM | POA: Diagnosis not present

## 2024-01-05 DIAGNOSIS — L718 Other rosacea: Secondary | ICD-10-CM | POA: Diagnosis not present

## 2024-01-05 MED ORDER — DOXYCYCLINE HYCLATE 100 MG PO CAPS
100.0000 mg | ORAL_CAPSULE | Freq: Two times a day (BID) | ORAL | 0 refills | Status: DC
  Filled 2024-01-05: qty 60, 30d supply, fill #0

## 2024-01-08 NOTE — Progress Notes (Unsigned)
 Cardiology Office Note  Date:  01/09/2024   ID:  Sheri [name], DOB October 24, 1960, MRN [id number]  PCP:  Hadassah Letters, MD   Chief Complaint  Patient presents with   Follow-up    New pt has been doing well with no complaints of chest pain, chest pressure or SOB, medciation reviewed verbally with patient    HPI:  Sheri [name] is a 63 year old woman with past medical history of Smoker 72 to 63 yo Breast cancer, 2021, XRT on right palpitations Anxiety Who presents by referral from Dr. Geraldine Kling for consultation of her palpitations, cardiac risk factors  Previously seen by Dr. Annabell Key, was on metoprolol  at that time for blood pressure, palpitations Was changed from metoprolol  to propranolol  as needed BP stable off metoprolol , not elevated  Event monitor April 2025 HR 54 - 176, average 72 bpm. 16 nonsustained SVT, longest 15 beats. Rare supraventricular and ventricular ectopy. No atrial fibrillation. Symptom activations correspond to sinus rhythm +/- PAC/PVC/AT   Active, exercises Rare palpitations, minimally symptomatic, has not required much propranolol   CT scan abdomen pelvis reviewed, minimal distal aortic atherosclerosis  Total cholesterol 160  EKG personally reviewed by myself on todays visit EKG Interpretation Date/Time:  Monday January 09 2024 08:43:14 EDT Ventricular Rate:  59 PR Interval:  156 QRS Duration:  88 QT Interval:  410 QTC Calculation: 405 R Axis:   83  Text Interpretation: Sinus bradycardia When compared with ECG of 03-Jan-2020 14:20, No significant change was found Confirmed by Belva Boyden [phone]) on 01/09/2024 8:54:56 AM    PMH:   has a past medical history of Arthritis, Breast cancer (HCC) (2020), Dysrhythmia, Family history of breast cancer, Family history of prostate cancer, GERD (gastroesophageal reflux disease), Hypertension, Hypothyroidism, Personal history of radiation therapy, and Vitamin D deficiency.  PSH:    Past Surgical  History:  Procedure Laterality Date   BREAST BIOPSY Left 2013   negative core   BREAST BIOPSY Right 2021   INVASIVE MAMMARY CARCINOMA Grade 1. Ductal carcinoma in situ: Present, low-grade   BREAST EXCISIONAL BIOPSY Right 01/01/2020   RF tag placement [id number]   BREAST LUMPECTOMY Right 01/07/2020   CESAREAN SECTION     COLONOSCOPY WITH PROPOFOL  N/A 02/16/2022   Procedure: COLONOSCOPY WITH PROPOFOL ;  Surgeon: Selena Daily, MD;  Location: [facility];  Service: Endoscopy;  Laterality: N/A;   INCONTINENCE SURGERY     LAPAROSCOPIC HYSTERECTOMY     complete   NASAL SINUS SURGERY Left 2010   PARTIAL MASTECTOMY WITH NEEDLE LOCALIZATION AND AXILLARY SENTINEL LYMPH NODE BX Right 01/07/2020   Procedure: PARTIAL MASTECTOMY WITH RADIOFREQUENCY TAG, RIGHT AXILLARY SENTINEL LYMPH NODE BIOPSY;  Surgeon: Eldred Grego, MD;  Location: [facility];  Service: General;  Laterality: Right;   POLYPECTOMY  02/16/2022   Procedure: POLYPECTOMY;  Surgeon: Selena Daily, MD;  Location: [facility];  Service: Endoscopy;;   TONSILLECTOMY      Current Outpatient Medications  Medication Sig Dispense Refill   acetaminophen  (TYLENOL ) 500 MG tablet Take 500-1,000 mg by mouth every 6 (six) hours as needed (for pain.).     alendronate  (FOSAMAX ) 70 MG tablet Take 1 tablet (70 mg total) by mouth once a week. Take with a full glass of water  on an empty stomach. 4 tablet 5   B Complex Vitamins (VITAMIN B COMPLEX PO) Take 1 tablet by mouth once a week.     calcium carbonate (TUMS - DOSED IN MG ELEMENTAL CALCIUM) 500 MG chewable tablet Chew  1-2 tablets by mouth daily as needed for indigestion or heartburn.     Calcium-Magnesium-Vitamin D (CALCIUM 1200+D3 PO) Take 1 Dose by mouth daily.     Cholecalciferol 25 MCG (1000 UT) tablet Take 1,000 Units by mouth 2 (two) times a week.      doxycycline  (VIBRAMYCIN ) 100 MG capsule Take 1 capsule (100 mg total) by mouth 2 (two) times daily. 60 capsule 0    estradiol  (ESTRACE ) 0.1 MG/GM vaginal cream Apply one pea-sized amount around the opening of the urethra daily for 2 weeks, then 3 times weekly moving forward. 42.5 g 12   lidocaine  (XYLOCAINE ) 5 % ointment Use a peasize amount (0.5 g) up to 3 times a day as needed, use 10-27min prior to intercourse. 35.44 g 0   linaclotide  (LINZESS ) 145 MCG CAPS capsule Take 1 capsule (145 mcg total) by mouth daily before breakfast. (Patient taking differently: Take 145 mcg by mouth as needed.) 30 capsule 0   propranolol  (INDERAL ) 10 MG tablet Take 1 tablet (10 mg total) by mouth daily as needed. 90 tablet 3   tamoxifen  (NOLVADEX ) 20 MG tablet Take 1 tablet (20 mg total) by mouth daily. 30 tablet 3   traZODone  (DESYREL ) 50 MG tablet Take 1 tablet (50 mg total) by mouth at bedtime. 90 tablet 3   trospium  (SANCTURA ) 20 MG tablet Take 1 tablet (20 mg total) by mouth 2 (two) times daily. DO NOT START UNTIL AFTER BLADDER TESTING RESULTS 60 tablet 2   No current facility-administered medications for this visit.    Allergies:   Tape   Social History:  The patient  reports that she has quit smoking. She has never used smokeless tobacco. She reports that she does not currently use alcohol. She reports that she does not use drugs.   Family History:   family history includes Breast cancer in her maternal grandmother; Cancer in her paternal grandmother; Hypertension in her father; Prostate cancer in her brother and father.    Review of Systems: Review of Systems  Constitutional: Negative.   HENT: Negative.    Respiratory: Negative.    Cardiovascular: Negative.   Gastrointestinal: Negative.   Musculoskeletal: Negative.   Neurological: Negative.   Psychiatric/Behavioral: Negative.    All other systems reviewed and are negative.  PHYSICAL EXAM: VS:  BP 118/80 (BP Location: Left Arm, Patient Position: Sitting, Cuff Size: Normal)   Pulse (!) 59   Ht 5' 2 (1.575 m)   Wt 133 lb 12.8 oz (60.7 kg)   BMI 24.47 kg/m   , BMI Body mass index is 24.47 kg/m. GEN: Well nourished, well developed, in no acute distress HEENT: normal Neck: no JVD, carotid bruits, or masses Cardiac: RRR; no murmurs, rubs, or gallops,no edema  Respiratory:  clear to auscultation bilaterally, normal work of breathing GI: soft, nontender, nondistended, + BS MS: no deformity or atrophy Skin: warm and dry, no rash Neuro:  Strength and sensation are intact Psych: euthymic mood, full affect  Recent Labs: 10/04/2023: ALT 10; Hemoglobin 11.8; Platelets 215 11/03/2023: BUN 14; Creatinine, Ser 0.82; Potassium 4.0; Sodium 141    Lipid Panel Lab Results  Component Value Date   CHOL 161 10/04/2023   HDL 63 10/04/2023   LDLCALC 82 10/04/2023   TRIG 83 10/04/2023    Wt Readings from Last 3 Encounters:  01/09/24 133 lb 12.8 oz (60.7 kg)  12/22/23 131 lb 3.2 oz (59.5 kg)  11/03/23 135 lb 12.8 oz (61.6 kg)     ASSESSMENT AND PLAN:  Problem List Items Addressed This Visit   None Visit Diagnoses       Palpitations    -  Primary   Relevant Orders   EKG 12-Lead (Completed)     Aortic atherosclerosis (HCC)         Malignant neoplasm of right female breast, unspecified estrogen receptor status, unspecified site of breast (HCC)         History of smoking         PVC's (premature ventricular contractions)          Palpitations Having rare PVCs, rare short SVT on monitor No strong indication to change back to metoprolol  She is happy to stay on propranolol  as needed -No strong indication for echocardiogram, she will call if she would like this ordered - Normal EKG, normal clinical exam, less likely structural heart disease  Aortic atherosclerosis Noted on CT scan, minimal disease, images pulled up and reviewed Cholesterol very reasonable on no medication, prior smoking history stopped age 33, nondiabetic -Discussed calcium scoring every 5 or 10 years  History of smoking Age [age]  Breast cancer On right, greater than 30  rounds of radiation Completing tamoxifen  in 1 year  Patient seen in consultation for Dr. Geraldine Kling and will be referred back to her office for ongoing care of the issues detailed above  Signed, Juanda Noon, M.D., Ph.D. [facility] [place], [place] [phone]

## 2024-01-09 ENCOUNTER — Encounter: Payer: Self-pay | Admitting: Cardiovascular Disease

## 2024-01-09 ENCOUNTER — Ambulatory Visit: Attending: Cardiovascular Disease | Admitting: Cardiovascular Disease

## 2024-01-09 VITALS — BP 118/80 | HR 59 | Ht 62.0 in | Wt 133.8 lb

## 2024-01-09 DIAGNOSIS — I700 Atherosclerosis of aorta: Secondary | ICD-10-CM | POA: Diagnosis not present

## 2024-01-09 DIAGNOSIS — R002 Palpitations: Secondary | ICD-10-CM

## 2024-01-09 DIAGNOSIS — Z87891 Personal history of nicotine dependence: Secondary | ICD-10-CM | POA: Diagnosis not present

## 2024-01-09 DIAGNOSIS — C50911 Malignant neoplasm of unspecified site of right female breast: Secondary | ICD-10-CM

## 2024-01-09 DIAGNOSIS — I493 Ventricular premature depolarization: Secondary | ICD-10-CM | POA: Diagnosis not present

## 2024-01-09 NOTE — Patient Instructions (Addendum)
 Research echo and calcium score  Medication Instructions:  No changes  If you need a refill on your cardiac medications before your next appointment, please call your pharmacy.   Lab work: No new labs needed  Testing/Procedures:  Call if you would like to schedule :  Your physician has requested that you have an echocardiogram. Echocardiography is a painless test that uses sound waves to create images of your heart. It provides your doctor with information about the size and shape of your heart and how well your heart's chambers and valves are working.   You may receive an ultrasound enhancing agent through an IV if needed to better visualize your heart during the echo. This procedure takes approximately one hour.  There are no restrictions for this procedure.  This will take place at [address] [facility], [place] [address]  Please note: We ask at that you not bring children with you during ultrasound (echo/ vascular) testing. Due to room size and safety concerns, children are not allowed in the ultrasound rooms during exams. Our front office staff cannot provide observation of children in our lobby area while testing is being conducted. An adult accompanying a patient to their appointment will only be allowed in the ultrasound room at the discretion of the ultrasound technician under special circumstances. We apologize for any inconvenience.   Follow-Up: At [facility], you and your health needs are our priority.  As part of our continuing mission to provide you with exceptional heart care, we have created designated Provider Care Teams.  These Care Teams include your primary Cardiologist (physician) and Advanced Practice Providers (APPs -  Physician Assistants and Nurse Practitioners) who all work together to provide you with the care you need, when you need it.  You will need a follow up appointment as needed  Providers on your designated Care Team:    Laneta Pintos, NP Varney Gentleman, PA-C Cadence Gennaro Khat, [place]  COVID-19 Vaccine Information can be found at: [email or web address] For questions related to vaccine distribution or appointments, please email [email or web address] or call [phone].

## 2024-01-13 ENCOUNTER — Telehealth: Payer: Self-pay | Admitting: Obstetrics

## 2024-01-13 NOTE — Telephone Encounter (Signed)
 Rescheduled patient's Urodynamics study to a different day.  She said she would like to communicate with the physician on the purpose of doing the study.  She said her mesh may be bunched, but she does not want to put mesh back in.  Can you communicate through mychart please?

## 2024-01-17 MED FILL — Alendronate Sodium Tab 70 MG: ORAL | 28 days supply | Qty: 4 | Fill #2 | Status: AC

## 2024-01-24 ENCOUNTER — Ambulatory Visit: Admitting: Cardiovascular Disease

## 2024-01-30 ENCOUNTER — Other Ambulatory Visit: Payer: Self-pay | Admitting: Gastroenterology

## 2024-01-30 ENCOUNTER — Other Ambulatory Visit: Payer: Self-pay

## 2024-01-30 MED ORDER — LINACLOTIDE 145 MCG PO CAPS
145.0000 ug | ORAL_CAPSULE | Freq: Every day | ORAL | 0 refills | Status: DC
  Filled 2024-01-30: qty 30, 30d supply, fill #0

## 2024-02-13 ENCOUNTER — Other Ambulatory Visit: Payer: Self-pay

## 2024-02-21 MED FILL — Alendronate Sodium Tab 70 MG: ORAL | 28 days supply | Qty: 4 | Fill #3 | Status: AC

## 2024-02-27 ENCOUNTER — Telehealth: Payer: 59 | Admitting: Oncology

## 2024-02-27 DIAGNOSIS — Q828 Other specified congenital malformations of skin: Secondary | ICD-10-CM | POA: Diagnosis not present

## 2024-02-27 DIAGNOSIS — L718 Other rosacea: Secondary | ICD-10-CM | POA: Diagnosis not present

## 2024-03-07 ENCOUNTER — Encounter: Admitting: Obstetrics and Gynecology

## 2024-03-20 ENCOUNTER — Ambulatory Visit: Admitting: Obstetrics

## 2024-03-27 ENCOUNTER — Other Ambulatory Visit: Payer: Self-pay | Admitting: Gastroenterology

## 2024-03-27 ENCOUNTER — Other Ambulatory Visit: Payer: Self-pay

## 2024-03-27 MED FILL — Alendronate Sodium Tab 70 MG: ORAL | 28 days supply | Qty: 4 | Fill #4 | Status: AC

## 2024-03-28 ENCOUNTER — Ambulatory Visit (INDEPENDENT_AMBULATORY_CARE_PROVIDER_SITE_OTHER): Admitting: Obstetrics and Gynecology

## 2024-03-28 ENCOUNTER — Encounter: Payer: Self-pay | Admitting: Obstetrics and Gynecology

## 2024-03-28 ENCOUNTER — Other Ambulatory Visit: Payer: Self-pay

## 2024-03-28 VITALS — BP 128/81 | HR 67

## 2024-03-28 DIAGNOSIS — Z9889 Other specified postprocedural states: Secondary | ICD-10-CM

## 2024-03-28 DIAGNOSIS — R351 Nocturia: Secondary | ICD-10-CM

## 2024-03-28 DIAGNOSIS — N3281 Overactive bladder: Secondary | ICD-10-CM | POA: Diagnosis not present

## 2024-03-28 DIAGNOSIS — R350 Frequency of micturition: Secondary | ICD-10-CM | POA: Diagnosis not present

## 2024-03-28 DIAGNOSIS — N3289 Other specified disorders of bladder: Secondary | ICD-10-CM

## 2024-03-28 DIAGNOSIS — R948 Abnormal results of function studies of other organs and systems: Secondary | ICD-10-CM | POA: Diagnosis not present

## 2024-03-28 MED ORDER — SULFAMETHOXAZOLE-TRIMETHOPRIM 800-160 MG PO TABS
1.0000 | ORAL_TABLET | Freq: Two times a day (BID) | ORAL | 0 refills | Status: AC
  Filled 2024-03-28: qty 6, 3d supply, fill #0

## 2024-03-28 NOTE — Progress Notes (Signed)
 [facility] Urodynamics Procedure  Referring Physician: Herold Hadassah SQUIBB, MD Date of Procedure: 03/28/2024  Sheri [name] is a 63 y.o. female who presents for urodynamic evaluation. Indication(s) for study: Possible mesh blockage   Vital Signs: BP 128/81   Pulse 67   Laboratory Results: A catheterized urine specimen revealed:  POC urine:  Lab Results  Component Value Date   COLORU yellow 12/22/2023   CLARITYU clear 12/22/2023   GLUCOSEUR negative 12/22/2023   BILIRUBINUR negative 12/22/2023   KETONESU Negative 08/03/2023   SPECGRAV 1.010 12/22/2023   RBCUR trace-lysed (A) 12/22/2023   PHUR 6.5 12/22/2023   PROTEINUR NEGATIVE 12/22/2023   UROBILINOGEN 0.2 12/22/2023   LEUKOCYTESUR Negative 12/22/2023     Voiding Diary: Deferred   Procedure Timeout:  The correct patient was verified and the correct procedure was verified. The patient was in the correct position and safety precautions were reviewed based on at the patient's history.  Urodynamic Procedure A 74F dual lumen urodynamics catheter was placed under sterile conditions into the patient's bladder. A 74F catheter was placed into the rectum in order to measure abdominal pressure. EMG patches were placed in the appropriate position.  All connections were confirmed and calibrations/adjusted made. Saline was instilled into the bladder through the dual lumen catheters.  Cough/valsalva pressures were measured periodically during filling.  Patient was allowed to void.  The bladder was then emptied of its residual.  UROFLOW: Did not capture Patient voided with a residual of 10ml  CMG: This was performed with sterile water  in the sitting position at a fill rate of 30 mL/min.    First sensation of fullness was 234 mLs,  First urge was 235 mLs,  Strong urge was 438 mLs and  Capacity was 611 mLs  Stress incontinence was not demonstrated Highest negative CLPP was 93 cmH20 at 236 ml. Highest negative VLPP  was 76 cmH20at 236 ml.   Detrusor function was normal, but patient had a large DO when she went from sitting to standing and she reported feeling like she was leaking, but no leakage seen.  The first occurred at 438 mL to 4 cm of water  and was associated with urge.  Compliance:  WNL. End fill detrusor pressure was 0 cmH20.   UPP: MUCP without barrier reduction was 101 cm of water .    MICTURITION STUDY: Patient was unable to void with catheter in place. Once catheter was removed, patient began to pee and then stopped reporting it was too painful to urinate. Patient stated If it hurts I am just going to make myself hold it. Patient voided 11ml and had a cathed PVR of 600. Lidocaine  was used with the last catheter.  EMG: This was performed with patches.  She had voluntary contractions, recruitment with fill was present and urethral sphincter was not relaxed with void.  The details of the procedure with the study tracings have been scanned into EPIC.   Urodynamic Impression:  1. Sensation was reduced; capacity was normal 2. Stress Incontinence was not demonstrated at normal pressures; 3. Detrusor Overactivity was demonstrated with sanding. 4. Emptying was dysfunctional with a elevated PVR as patient was unable to void.   Plan: -Attempted to discuss with patient that we could retry to fill with the lidocaine  on board to at least get a better micturition study or repeat the uroflow as that did not capture. Patient reported she did not wish to do this and would plan to schedule for pelvic floor PT at [facility]  -  I called and spoke to [facility] at PT scheduling who is calling patient today to work on getting her scheduled.  -Patient also reported she needed an antibiotic. Bactrim  sent in at her request.  - The patient will follow up  to discuss the findings and treatment options.

## 2024-03-29 ENCOUNTER — Other Ambulatory Visit: Payer: Self-pay

## 2024-03-30 ENCOUNTER — Other Ambulatory Visit: Payer: Self-pay

## 2024-03-31 ENCOUNTER — Other Ambulatory Visit: Payer: Self-pay

## 2024-04-01 ENCOUNTER — Other Ambulatory Visit: Payer: Self-pay

## 2024-04-02 ENCOUNTER — Other Ambulatory Visit: Payer: Self-pay

## 2024-04-03 ENCOUNTER — Other Ambulatory Visit: Payer: Self-pay

## 2024-04-03 ENCOUNTER — Other Ambulatory Visit: Payer: Self-pay | Admitting: Gastroenterology

## 2024-04-03 MED ORDER — LINACLOTIDE 145 MCG PO CAPS
145.0000 ug | ORAL_CAPSULE | Freq: Every day | ORAL | 0 refills | Status: AC
  Filled 2024-04-03: qty 30, 30d supply, fill #0

## 2024-04-10 ENCOUNTER — Other Ambulatory Visit: Payer: Self-pay | Admitting: Physician Assistant

## 2024-04-10 DIAGNOSIS — N390 Urinary tract infection, site not specified: Secondary | ICD-10-CM

## 2024-04-11 ENCOUNTER — Other Ambulatory Visit: Payer: Self-pay

## 2024-04-11 ENCOUNTER — Other Ambulatory Visit: Payer: Self-pay | Admitting: Physician Assistant

## 2024-04-11 MED ORDER — ESTRADIOL 0.1 MG/GM VA CREA
TOPICAL_CREAM | VAGINAL | 12 refills | Status: AC
  Filled 2024-04-11: qty 42.5, 90d supply, fill #0

## 2024-04-23 DIAGNOSIS — C50411 Malignant neoplasm of upper-outer quadrant of right female breast: Secondary | ICD-10-CM

## 2024-04-23 NOTE — Research (Signed)
 [place] [id number] Understanding and Predicting Breast Cancer Events after Treatment (UPBEAT)   4 Year Follow Up:   Research nurse contacted patient via telephone today via secure line, patient verified her identity.Patient had her scheduled follow up with Dr. Melanee on 08/26/2023 this year. Patient to continue taking Fosamax  for 3 years for her osteoporosis and complete her endocrine therapy in September 2026 per Dr. Darold progress notes. Patient also saw Dr. Gollan on 01/09/2024 for palpitations, and cardiac risk factors. Patient with palpitations being treated with propranolol  and Aortic atherosclerosis with minimal disease per his progress notes. Patient completed the required [facility] Cardiomyopathy Questionnaire [facility]) and the Cardiovascular Event Form without any difficulty via the telephone today. The patient states she has not been hospitalized since her last visit for the protocol. She denies any cardiac conditions, MI, CVA, PCI, CABG or heart failure. There have been no changes in her medical conditions other than urinary frequency. The patient is aware there will be a follow up phone call next year per protocol requirements around the same time. She was encouraged to call if she has any questions or concerns for research.  Reena Romans, RN 04/23/24 3:41 PM

## 2024-04-26 ENCOUNTER — Encounter: Payer: Self-pay | Admitting: Obstetrics

## 2024-04-26 ENCOUNTER — Ambulatory Visit: Admitting: Obstetrics

## 2024-04-26 VITALS — BP 126/82 | HR 65

## 2024-04-26 DIAGNOSIS — N941 Unspecified dyspareunia: Secondary | ICD-10-CM | POA: Diagnosis not present

## 2024-04-26 DIAGNOSIS — K5900 Constipation, unspecified: Secondary | ICD-10-CM

## 2024-04-26 DIAGNOSIS — Z9889 Other specified postprocedural states: Secondary | ICD-10-CM | POA: Diagnosis not present

## 2024-04-26 NOTE — Patient Instructions (Addendum)
 Please call [phone] to schedule the earliest appointment for pelvic floor PT.  Continue titration of miralax, increase to 2-3 capfuls/day as needed. Once you find the correct dose, use same dose daily.

## 2024-04-26 NOTE — Assessment & Plan Note (Signed)
-   s/p TVH, BSO bladder tack with mesh in 2008/2009 by Dr. Margean  due to vaginal bulge at opening and stress urinary leakage - pain with palpation of anterior vaginal wall with pelvic floor myofascial pain - urodynamics 03/28/24. Uroflow not captured, voided with PVR 10mL. MCC , SUI with MUCP 101cm H2O, DO with standing. Patient was unable to void. - negative cystoscopy in 2022 by Dr. Penne (urology), discussed possible need to repeat if persistent symptoms - discussed need to repeat bladder emptying due to elevated PVR x 2, patient declined repeat PVR or bladder assessment at this time. Discussed inability to provide treatment for urinary incontinence until repeat normal PVR. - encouraged to schedule pelvic floor PT and reassess symptoms

## 2024-04-26 NOTE — Assessment & Plan Note (Signed)
-   Bristol I to III with straining, incomplete emptying - Linzess  PRN, prior use of miralax and fiber supplementation with relief that subsides after 2 weeks - For constipation, we reviewed the importance of a better bowel regimen.  We also discussed the importance of avoiding chronic straining, as it can exacerbate her pelvic floor symptoms; we discussed treating constipation and straining prior to surgery, as postoperative straining can lead to damage to the repair and recurrence of symptoms. We discussed initiating therapy with increasing fluid intake, fiber supplementation, stool softeners, and laxatives such as miralax.  - referral to pelvic floor PT due to pelvic floor myofascial pain and high tone pelvic floor on exam, provided information for pt to schedule  - encouraged titration of fiber supplementation or miralax to improve stool consistency, increase miralax to 2-3 capfuls/day - squatting for defecation without relief

## 2024-04-26 NOTE — Progress Notes (Signed)
 [facility] Return Visit  SUBJECTIVE  History of Present Illness: Sheri [name] is a 63 y.o. female seen in follow-up for urinary frequency, nocturia, history of pelvic surgery, constipation, dyspareunia, abnormal urine testing. Plan at last visit was increase vaginal estrogen, pelvic floor PT, continue titration of bowel regimen.   Reports improvement of dry sensation with vaginal estrogen with reduced urgency increased to 1g 3x/week. Declines repeat evaluation of bladder emptying or postvoid residual SUI 1-2x/week down when is at home from 1x/day Pad use: 2 liners/ mini-pads per day mostly with dry pads, only leaks 1-2x on pad/week. Symptoms worsened at the end of the day  Day time voids 7.  Nocturia: 3x/night to void with insomnia  Took antibiotic after urodynamics.  Eats prune eaily, 1 capful miralax  Tried squatting position for defecation without relief, topical lidocaine  with burning sensation. Reports receiving call from pelvic floor PT, however wanted to postpone until after discussion today.  Urodynamic Impression 03/28/24:  1. Sensation was reduced; capacity was normal at 2. Stress Incontinence was not demonstrated at normal pressures; MUCP 101cmH20   3. Detrusor Overactivity was demonstrated with standing. 4. Emptying was dysfunctional with a elevated PVR as patient was unable to void.  Uroflow not captured, voided with PVR 10mL  Past Medical History: Patient  has a past medical history of Arthritis, Breast cancer (HCC) (2020), Dysrhythmia, Family history of breast cancer, Family history of prostate cancer, GERD (gastroesophageal reflux disease), Hypertension, Hypothyroidism, Personal history of radiation therapy, and Vitamin D deficiency.   Past Surgical History: She  has a past surgical history that includes Laparoscopic hysterectomy; Tonsillectomy; Incontinence surgery; Nasal sinus surgery (Left, 2010); Cesarean section; Partial mastectomy with  needle localization and axillary sentinel lymph node bx (Right, 01/07/2020); Breast biopsy (Left, 2013); Breast biopsy (Right, 2021); Breast excisional biopsy (Right, 01/01/2020); Breast lumpectomy (Right, 01/07/2020); Colonoscopy with propofol  (N/A, 02/16/2022); and polypectomy (02/16/2022).   Medications: She has a current medication list which includes the following prescription(s): acetaminophen , alendronate , azelaic acid, b complex vitamins, calcium carbonate, calcium-magnesium-vitamin d, cholecalciferol, doxycycline , estradiol , eucrisa, lidocaine , linaclotide , propranolol , tamoxifen , trazodone , and trospium .   Allergies: Patient is allergic to tape.   Social History: Patient  reports that she has quit smoking. She has never used smokeless tobacco. She reports that she does not currently use alcohol. She reports that she does not use drugs.     OBJECTIVE     Physical Exam: Vitals:   04/26/24 1003  BP: 126/82  Pulse: 65   Gen: No apparent distress, A&O x 3.  Detailed Urogynecologic Evaluation:  Deferred.       ASSESSMENT AND PLAN    Ms. Vogt is a 63 y.o. with:  1. Constipation, unspecified constipation type   2. History of pelvic surgery   3. Dyspareunia in female     Constipation, unspecified constipation type Assessment & Plan: - [place] I to III with straining, incomplete emptying - Linzess  PRN, prior use of miralax and fiber supplementation with relief that subsides after 2 weeks - For constipation, we reviewed the importance of a better bowel regimen.  We also discussed the importance of avoiding chronic straining, as it can exacerbate her pelvic floor symptoms; we discussed treating constipation and straining prior to surgery, as postoperative straining can lead to damage to the repair and recurrence of symptoms. We discussed initiating therapy with increasing fluid intake, fiber supplementation, stool softeners, and laxatives such as miralax.  - referral to  pelvic floor PT due to pelvic  floor myofascial pain and high tone pelvic floor on exam, provided information for pt to schedule  - encouraged titration of fiber supplementation or miralax to improve stool consistency, increase miralax to 2-3 capfuls/day - squatting for defecation without relief   History of pelvic surgery Assessment & Plan: - s/p TVH, BSO bladder tack with mesh in 2008/2009 by Dr. Sharmon  due to vaginal bulge at opening and stress urinary leakage - pain with palpation of anterior vaginal wall with pelvic floor myofascial pain - urodynamics 03/28/24. Uroflow not captured, voided with PVR 10mL. M[facility] , SUI with MUCP 101cm H2O, DO with standing. Patient was unable to void. - negative cystoscopy in 2022 by Dr. Penne (urology), discussed possible need to repeat if persistent symptoms - discussed need to repeat bladder emptying due to elevated PVR x 2, patient declined repeat PVR or bladder assessment at this time. Discussed inability to provide treatment for urinary incontinence until repeat normal PVR. - encouraged to schedule pelvic floor PT and reassess symptoms   Dyspareunia in female Assessment & Plan: - pain with insertion  - reports perineorrhaphy with episiotomy scar revision at the time of vaginal mesh surgery for prolapse and stress urinary incontinence - pain with palpation of levator ani bilaterally and pain along anterior vaginal wall without mesh exposure by palpation or visualization. Possible redundant mesh from midurethral region to bladder neck by palpation. Pain with insertion of catheter. - The origin of pelvic floor muscle spasm can be multifactorial, including primary, reactive to a different pain source, trauma, or even part of a centralized pain syndrome.Treatment options include pelvic floor physical therapy, local (vaginal) or oral  muscle relaxants, pelvic muscle trigger point injections or centrally acting pain medications.   - discussed  concerns of pain in the setting of prior vaginal mesh placement, discussed risk of vaginal mesh excisions including infection, bleeding, persistent pain, return of prolapse or incontinence. Discussed risk of persistent pelvic pain after mesh revision/excision.  - continue low dose vaginal estrogen 0.5-1g 3x/week in the vagina - referral to pelvic floor PT due to pelvic floor myofascial pain and encouraged pt to scheduled - Rx topical lidocaine  with burning    Lianne ONEIDA Gillis, MD

## 2024-04-26 NOTE — Assessment & Plan Note (Addendum)
-   pain with insertion  - reports perineorrhaphy with episiotomy scar revision at the time of vaginal mesh surgery for prolapse and stress urinary incontinence - pain with palpation of levator ani bilaterally and pain along anterior vaginal wall without mesh exposure by palpation or visualization. Possible redundant mesh from midurethral region to bladder neck by palpation. Pain with insertion of catheter. - The origin of pelvic floor muscle spasm can be multifactorial, including primary, reactive to a different pain source, trauma, or even part of a centralized pain syndrome.Treatment options include pelvic floor physical therapy, local (vaginal) or oral  muscle relaxants, pelvic muscle trigger point injections or centrally acting pain medications.   - discussed concerns of pain in the setting of prior vaginal mesh placement, discussed risk of vaginal mesh excisions including infection, bleeding, persistent pain, return of prolapse or incontinence. Discussed risk of persistent pelvic pain after mesh revision/excision.  - continue low dose vaginal estrogen 0.5-1g 3x/week in the vagina - referral to pelvic floor PT due to pelvic floor myofascial pain and encouraged pt to scheduled - Rx topical lidocaine  with burning

## 2024-05-10 ENCOUNTER — Other Ambulatory Visit: Payer: Self-pay

## 2024-05-16 ENCOUNTER — Other Ambulatory Visit: Payer: Self-pay

## 2024-05-16 MED ORDER — AZELAIC ACID 15 % EX GEL
Freq: Every evening | CUTANEOUS | 6 refills | Status: AC
  Filled 2024-05-16 – 2024-05-17 (×2): qty 50, 30d supply, fill #0
  Filled 2024-05-18: qty 100, 60d supply, fill #0

## 2024-05-17 ENCOUNTER — Other Ambulatory Visit: Payer: Self-pay

## 2024-05-18 ENCOUNTER — Other Ambulatory Visit: Payer: Self-pay

## 2024-05-18 ENCOUNTER — Encounter: Payer: Self-pay | Admitting: Pharmacist

## 2024-05-18 ENCOUNTER — Other Ambulatory Visit (HOSPITAL_COMMUNITY): Payer: Self-pay

## 2024-05-18 ENCOUNTER — Other Ambulatory Visit: Payer: Self-pay | Admitting: Gastroenterology

## 2024-05-18 MED FILL — Alendronate Sodium Tab 70 MG: ORAL | 28 days supply | Qty: 4 | Fill #5 | Status: AC

## 2024-05-21 ENCOUNTER — Other Ambulatory Visit (HOSPITAL_COMMUNITY): Payer: Self-pay

## 2024-05-24 ENCOUNTER — Encounter (HOSPITAL_COMMUNITY): Payer: Self-pay

## 2024-05-24 ENCOUNTER — Other Ambulatory Visit (HOSPITAL_COMMUNITY): Payer: Self-pay

## 2024-06-13 ENCOUNTER — Encounter: Payer: Self-pay | Admitting: Physical Therapy

## 2024-06-13 ENCOUNTER — Ambulatory Visit: Attending: Obstetrics | Admitting: Physical Therapy

## 2024-06-13 DIAGNOSIS — M533 Sacrococcygeal disorders, not elsewhere classified: Secondary | ICD-10-CM | POA: Insufficient documentation

## 2024-06-13 DIAGNOSIS — R2689 Other abnormalities of gait and mobility: Secondary | ICD-10-CM | POA: Diagnosis not present

## 2024-06-13 DIAGNOSIS — M25552 Pain in left hip: Secondary | ICD-10-CM | POA: Insufficient documentation

## 2024-06-13 NOTE — Patient Instructions (Signed)

## 2024-06-13 NOTE — Therapy (Unsigned)
 OUTPATIENT PHYSICAL THERAPY EVALUATION   Patient Name: Sheri [name] MRN: [id number] DOB:15-Aug-1960, 63 y.o., female Today's Date: 06/13/2024   PT End of Session - 06/13/24 1025     Visit Number 1    Number of Visits 10    Date for Recertification  08/22/24    PT Start Time 1022    PT Stop Time 1105    PT Time Calculation (min) 43 min    Activity Tolerance Patient tolerated treatment well    Behavior During Therapy [facility] for tasks assessed/performed          Past Medical History:  Diagnosis Date   Arthritis    Breast cancer (HCC) 2020   right breast   Dysrhythmia    tachycardia   Family history of breast cancer    Family history of prostate cancer    GERD (gastroesophageal reflux disease)    Hypertension    Hypothyroidism    Personal history of radiation therapy    Vitamin D deficiency    Past Surgical History:  Procedure Laterality Date   BREAST BIOPSY Left 2013   negative core   BREAST BIOPSY Right 2021   INVASIVE MAMMARY CARCINOMA Grade 1. Ductal carcinoma in situ: Present, low-grade   BREAST EXCISIONAL BIOPSY Right 01/01/2020   RF tag placement [id number]   BREAST LUMPECTOMY Right 01/07/2020   CESAREAN SECTION     COLONOSCOPY WITH PROPOFOL  N/A 02/16/2022   Procedure: COLONOSCOPY WITH PROPOFOL ;  Surgeon: Unk Corinn Skiff, MD;  Location: [facility];  Service: Endoscopy;  Laterality: N/A;   INCONTINENCE SURGERY     LAPAROSCOPIC HYSTERECTOMY     complete   NASAL SINUS SURGERY Left 2010   PARTIAL MASTECTOMY WITH NEEDLE LOCALIZATION AND AXILLARY SENTINEL LYMPH NODE BX Right 01/07/2020   Procedure: PARTIAL MASTECTOMY WITH RADIOFREQUENCY TAG, RIGHT AXILLARY SENTINEL LYMPH NODE BIOPSY;  Surgeon: Rodolph Romano, MD;  Location: [facility];  Service: General;  Laterality: Right;   POLYPECTOMY  02/16/2022   Procedure: POLYPECTOMY;  Surgeon: Unk Corinn Skiff, MD;  Location: [facility];  Service: Endoscopy;;   TONSILLECTOMY     Patient Active  Problem List   Diagnosis Date Noted   History of pelvic surgery 12/22/2023   Nocturia 12/22/2023   Constipation 12/22/2023   Dyspareunia in female 12/22/2023   Situational anxiety 09/11/2023   Insomnia 09/11/2023   Urinary frequency 09/11/2023   Osteoporosis 09/11/2023   Essential (primary) hypertension 02/04/2023   Malignant (primary) neoplasm, unspecified (HCC) 08/02/2022   Polyp of cecum    Cervical disc disease 01/06/2022   Malignant neoplasm of right breast in female, estrogen receptor positive (HCC) 05/05/2020   Family history of breast cancer    Family history of prostate cancer    Invasive carcinoma of breast (HCC) 12/25/2019   Benign essential hypertension 05/21/2016   B12 deficiency 09/27/2014    PCP: [facility]  REFERRING PROVIDER: [place]  REFERRING DIAG:  R35.0 (ICD-10-CM) - Urinary frequency R35.1 (ICD-10-CM) - Nocturia Z98.890 (ICD-10-CM) - History of pelvic surgery K59.00 (ICD-10-CM) - Constipation, unspecified constipation type N94.10 (ICD-10-CM) - Dyspareunia in female  Rationale for Evaluation and Treatment Rehabilitation  THERAPY DIAG:  Sacrococcygeal disorders, not elsewhere classified  Pain in left hip  Other abnormalities of gait and mobility  ONSET DATE:   SUBJECTIVE:  SUBJECTIVE STATEMENT TODAY:  SUBJECTIVE STATEMENT on EVAL 06/13/24   1) nocturia - 2 x night  , has not had sleep study  2)  urge incontinence -  leakage before getting to the toilet when she puts key in door  3) pain with sexual intercourse-  pain 8/10 at the opening.  Pt said her MD explained to her after MD performed internal assessment and explained to pt the bladder sling scar was bunched up and low like a self inside vagina  4) constipation:  takes Linsezz every 3rd day and has bowel movements  once she takes it every 3 rd day ( stool consistency type 7) .   Without Linsezz, stool consistency 2 , pt has to strain every time.,    5)  L hip pain: occurs with walking over 2 miles, moving push mover ,  4-5/10 relieved by tylenel     PERTINENT HISTORY:   Breast Cancer w/ Partial mastectomy  Breast lumpectomy (Right) Breast biopsy (Left)  Cesarean section Incontinence surgery Laparoscopic hysterectomy   Not tested for OSA  Works out daily  on youtube 30 min, bands    PAIN:  Are you having pain? Yes: see above   PRECAUTIONS: No   WEIGHT BEARING RESTRICTIONS: No   FALLS:  Has patient fallen in last 6 months? No  LIVING ENVIRONMENT: Lives with: alone  Lives in: one story  Stairs: 2 STE     OCCUPATION: RN  with patient handling, on her feet 9 hours 4 days week   PLOF: Independent  PATIENT GOALS:  Return sexual intercourse, less unexpected incontinence    OBJECTIVE:    [facility] PT Assessment - 06/13/24 1059       Observation/Other Assessments   Observations ankles crossed, slumped sitting      Palpation   SI assessment  L iliac crest higher, R shoulder higher,      Ambulation/Gait   Gait Comments 1.42 m/s, L hip sway, limited R posterior rotation            Self-Care   Self-Care Other Self-Care Comments    Other Self-Care Comments  explained regional interdependent approach to address pain at different body parts, explained upcoming visits to realign posture and spine.pelvis to improve Sx and achieve goals, showed anatomy images of deep core system ,   Explained scar adhesions from past surgeries impacting Sx      Therapeutic Activites    Therapeutic Activities Other Therapeutic Activities    Other Therapeutic Activities inquired about meaningful tasks and role of learning proper body mechanics, future sessions to help realign pelvis and spine and to learn co-activation of deep core system when performing against gravity tasks . These improvements will  help address pain and Sx     Neuro Re-ed    Neuro Re-ed Details  cued for alignment and proprioception of pelvis and LKC and spine in  sit to stand and for proper sitting posture to activate deep core system           HOME EXERCISE PROGRAM: See pt instruction section    ASSESSMENT:  CLINICAL IMPRESSION: Pt is a 63  yo  who presents with  following issues which impact QOL, ADL,  fitness, social and community activities:    1) nocturia  2)  urge incontinence  3) pain with sexual intercourse- 4) constipation 5)  L hip pain   Pt's musculoskeletal assessment revealed uneven pelvic girdle and shoulder height, asymmetries to gait pattern, limited spinal /pelvic mobility,  dyscoordination and strength of pelvic floor mm, hip weakness, poor body mechanics which places strain on the abdominal/pelvic floor mm. These are deficits that indicate an ineffective intraabdominal pressure system associated with increased risk for pt's Sx.   These medical Hx and surgeries impact her Sx: Breast Cancer w/ Partial mastectomy  Breast lumpectomy (Right) Breast biopsy (Left)  Cesarean section Incontinence surgery Laparoscopic hysterectomy     Pt will benefit from propioception/ coordination/ body mechanics training and education with gravity-loaded tasks at work ( patient handling as RN)   and home and  fitness modifications in order to gain a more effective intraabdominal pressure system to minimize Sx. Advised pt to not perform sit-ups and crunches as these movement patterns lead to more downward forces on the pelvic floor, negatively impacting abdominopelvic/spinal dysfunctions.   Pt was provided education on etiology of Sx with anatomy, physiology explanation with images along with the benefits of customized pelvic PT Tx based on pt's medical conditions and musculoskeletal deficits.  Explained the physiology of deep core mm coordination and roles of pelvic floor function in urination, defecation,  sexual function, and postural control with deep core mm system, and the role of posture and alignment to help pelvic issues.   Explained regional interdependent approach to address pain at different body parts, explained upcoming visits to realign posture and spine.pelvis to improve Sx and achieve goals, showed anatomy images of deep core system.  Explained scar adhesions from past surgeries impacting Sx   Following Tx today which pt tolerated without complaints,  pt demo'd proper body mechanics to minimize straining pelvic floor.   Plan to address realignment of spine/ pelvis at next session to help promote optimize intra-abdominal pressure system (IAP)  for improved pelvic floor function, trunk stability, gait, balance, stabilization with mobility tasks.  Plan to address pelvic floor issues once pelvis and spine are realigned to yield better outcomes.                                                       Pt benefits from skilled PT.    OBJECTIVE IMPAIRMENTS decreased activity tolerance, decreased coordination, decreased endurance, decreased mobility, difficulty walking, decreased ROM, decreased strength, decreased safety awareness, hypomobility, increased muscle spasms, impaired flexibility, improper body mechanics, postural dysfunction, and pain. scar restrictions   ACTIVITY LIMITATIONS  self-care,  sleep, home chores, work tasks    PARTICIPATION LIMITATIONS:  community , social activties    PERSONAL FACTORS   affecting patient's functional outcome:    REHAB POTENTIAL: Good   CLINICAL DECISION MAKING: Evolving/moderate complexity   EVALUATION COMPLEXITY: Moderate    PATIENT EDUCATION:    Education details: Showed pt anatomy images. Explained muscles attachments/ connection, physiology of deep core system/ spinal- thoracic-pelvis-lower kinetic chain as they relate to pt's presentation, Sx, and past Hx. Explained what and how these areas of deficits need to be restored to balance and  function    See Therapeutic activity / neuromuscular re-education section  Answered pt's questions.   Person educated: Patient Education method: Explanation, Demonstration, Tactile cues, Verbal cues, and Handouts Education comprehension: verbalized understanding, returned demonstration, verbal cues required, tactile cues required, and needs further education     PLAN: PT FREQUENCY: 1x/week   PT DURATION: 10 weeks   PLANNED INTERVENTIONS:   Gait training;Stair training;Functional mobility training;DME Instruction;Therapeutic activities;Therapeutic exercise;Balance  training;Neuromuscular re-education;Patient/family education;Vestibular;Visual/perceptual remediation/compensation;Passive range of motion;Moist Heat;Cryotherapy;Traction;Canalith Repostioning;Joint Manipulations;Manual lymph drainage;Manual techniques;Scar mobilization;Energy conservation;Dry needling;ADLs/Self Care Home Management;Biofeedback;Electrical Stimulation;Taping    PLAN FOR NEXT SESSION: See clinical impression for plan     GOALS: Goals reviewed with patient? Yes  SHORT TERM GOALS: Target date: 07/11/2024     Pt will demo IND with HEP                    Baseline: Not IND            Goal status: INITIAL   LONG TERM GOALS: Target date: 08/22/2024    1.Pt will demo proper deep core coordination without chest breathing and optimal excursion of diaphragm/pelvic floor in order to promote spinal stability and pelvic floor function  Baseline: dyscoordination Goal status: INITIAL  2.  Pt will demo proper body mechanics in against gravity tasks and ADLs ,   work tasks  ( patient handling as [employer])  fitness (Works out daily  on [employer] 30 min, bands )     to minimize straining pelvic floor / back     Baseline: not IND, improper form that places strain on pelvic floor  Goal status: INITIAL    3. Pt will demo  reciprocal gait pattern, longer stride length  in order to ambulate safely in community and return  to fitness routine  Baseline: 1.42 m/s, L hip sway, limited R posterior rotation  Goal status: INITIAL    4. Pt will demo levelled pelvic girdle and shoulder height in order to progress to deep core strengthening HEP and restore mobility at spine, pelvis, gait, posture minimize falls, and improve balance  Baseline: L iliac crest higher, R shoulder higher,  Goal status: INITIAL   5. Pt will improve PFDI-7 questionnaire to  pts  score change  to demo improved QOL  Baseline:    ( greater pts indicate greater negative impact on QOL)     pts  ( total)    pts  ( UIQ-7 )    pts  ( CRAIQ-7 )    pts  ( POPIQ-7 )  Baseline:  Plan to assess at upcoming sessions Goal status: INITIAL  6. Pt will be provided research on association of OSA and nocturia , f/u on sleep study to rule in / out nocturia  Baseline: 1) nocturia - 2 x night  , has not had sleep study  Goal status: INITIAL  7. Pt will report no leakage getting to toilet when she puts key in door  Baseline:  urge incontinence -  leakage before getting to the toilet when she puts key in door  Goal status: INITIAL  8.  Pt will demo improve pelvic floor lengthening and report decreased sexual intercourse < 4/10 to improve QOL  Baseline: pain with sexual intercourse-  pain 8/10 at the opening.  Pt said her MD explained to her after MD performed internal assessment and explained to pt the bladder sling scar was bunched up and low like a self inside vagina  Goal status: INITIAL  9. Pt will report  BMs occurring daily or every other day  and stool consistency Type 4 across > 50% of the time with or without Linsezz. Baseline: constipation:  takes Linsezz every 3rd day and has bowel movements once she takes it every 3 rd day ( stool consistency type 7) .   Without Linsezz, stool consistency 2 , pt has to strain every time. Goal status: INITIAL  10. Pt reports able  to walk over 2 miles, moving push mover with < 1-2/10 L hip pain  Baseline:   L  hip pain: occurs with walking over 2 miles, moving push mover ,  4-5/10 relieved by tylenel  Goal status: INITIAL    Pia Lupe Plump, PT 06/13/2024, 11:01 AM

## 2024-06-26 ENCOUNTER — Other Ambulatory Visit: Payer: Self-pay

## 2024-06-26 ENCOUNTER — Ambulatory Visit: Admitting: Physical Therapy

## 2024-08-01 ENCOUNTER — Encounter: Admitting: Physical Therapy

## 2024-08-06 ENCOUNTER — Ambulatory Visit

## 2024-08-06 ENCOUNTER — Other Ambulatory Visit: Payer: Self-pay

## 2024-08-06 ENCOUNTER — Encounter: Payer: Self-pay | Admitting: *Deleted

## 2024-08-06 VITALS — BP 148/90 | HR 62 | Ht 62.0 in | Wt 142.0 lb

## 2024-08-06 DIAGNOSIS — I10 Essential (primary) hypertension: Secondary | ICD-10-CM | POA: Diagnosis not present

## 2024-08-06 MED ORDER — LOSARTAN POTASSIUM 25 MG PO TABS
25.0000 mg | ORAL_TABLET | Freq: Every day | ORAL | 0 refills | Status: AC
  Filled 2024-08-06 (×3): qty 90, 90d supply, fill #0

## 2024-08-06 NOTE — Progress Notes (Signed)
 "    Acute Patient Visit  Physician: Lagretta Loseke A Lanita Stammen, MD  Patient: Sheri [name] MRN: [id number] DOB: Nov 04, 1960 PCP: Herold Hadassah SQUIBB, MD (Inactive)     Subjective:   Chief Complaint  Patient presents with   Hypertension    Patient is concerned about her BP.     HPI: The patient is a 64 y.o. female who presents today for:   Discussed the use of AI scribe software for clinical note transcription with the patient, who gave verbal consent to proceed.  History of Present Illness   Sheri [name] is a 64 year old female with hypertension who presents with elevated blood pressure readings.  Elevated blood pressure - Elevated blood pressure readings over the past week, with measurements up to 154/94 mmHg after work - Blood pressure remains elevated despite resting at home for 5-10 minutes - No shortness of breath, chest pain, or fatigue - Recent use of leftover hydrochlorothiazide for 3 days without improvement in blood pressure  Weight gain - Recent weight gain of 10 pounds over the holidays - Attempting weight loss for the past 2 weeks  Palpitations - Palpitations previously present, now managed by eliminating caffeine and switching to decaf coffee.  On propranolol  10 mg for palpitations   ROS:   As noted in the HPI    ASSESMENT/PLAN:  Encounter Diagnoses  Name Primary?   Essential (primary) hypertension Yes   Benign essential hypertension     Orders Placed This Encounter  Procedures   TSH + free T4   Hemoglobin A1c   Comprehensive metabolic panel with GFR   CBC with Differential/Platelet   Urinalysis, Routine w reflex microscopic   Lipid panel    Assessment and Plan    Essential hypertension Blood pressure elevated at 154/94 mmHg. Recent weight gain may contribute.  - Ordered blood work for CBC/CMP and thyroid levels. - Prescribed losartan  25 mg daily. - Instructed to discontinue hydrochlorothiazide. - Provided blood pressure log for home  monitoring. - Scheduled follow-up in two weeks.      OBJECTIVE: Vitals:   08/06/24 1349  BP: (!) 148/90  Pulse: 62  SpO2: (!) 89%  Weight: 142 lb (64.4 kg)  Height: 5' 2 (1.575 m)    Body mass index is 25.97 kg/m.   Physical Exam Vitals reviewed.  Constitutional:      Appearance: Normal appearance. Well-developed with normal weight.  Cardiovascular:     Rate and Rhythm: Normal rate and regular rhythm. Normal heart sounds. Normal peripheral pulses Pulmonary:     Normal breath sounds with normal effort Skin:    General: Skin is warm and dry without noticeable rash. Neurological:     General: No focal deficit present.  Psychiatric:        Mood and Affect: Mood, behavior and cognition normal       Allergies Patient is allergic to tape.  Past Medical History Patient  has a past medical history of Arthritis, Breast cancer (HCC) (2020), Dysrhythmia, Family history of breast cancer, Family history of prostate cancer, GERD (gastroesophageal reflux disease), Hypertension, Hypothyroidism, Personal history of radiation therapy, and Vitamin D deficiency.  Surgical History Patient  has a past surgical history that includes Laparoscopic hysterectomy; Tonsillectomy; Incontinence surgery; Nasal sinus surgery (Left, 2010); Cesarean section; Partial mastectomy with needle localization and axillary sentinel lymph node bx (Right, 01/07/2020); Breast biopsy (Left, 2013); Breast biopsy (Right, 2021); Breast excisional biopsy (Right, 01/01/2020); Breast lumpectomy (Right, 01/07/2020); Colonoscopy with propofol  (N/A, 02/16/2022); and polypectomy (02/16/2022).  Family History Pateint's family history includes Bladder Cancer in her father; Breast cancer in her maternal grandmother; Cancer in her paternal grandmother; Hypertension in her father; Prostate cancer in her brother and father.  Social History Patient  reports that she has quit smoking. She has never used smokeless tobacco. She reports  that she does not currently use alcohol. She reports that she does not use drugs.    08/06/2024 "

## 2024-08-07 LAB — CBC WITH DIFFERENTIAL/PLATELET
Absolute Lymphocytes: 1645 {cells}/uL (ref 850–3900)
Absolute Monocytes: 462 {cells}/uL (ref 200–950)
Basophils Absolute: 49 {cells}/uL (ref 0–200)
Basophils Relative: 0.7 %
Eosinophils Absolute: 203 {cells}/uL (ref 15–500)
Eosinophils Relative: 2.9 %
HCT: 37.2 % (ref 35.9–46.0)
Hemoglobin: 12.4 g/dL (ref 11.7–15.5)
MCH: 30.3 pg (ref 27.0–33.0)
MCHC: 33.3 g/dL (ref 31.6–35.4)
MCV: 91 fL (ref 81.4–101.7)
MPV: 10 fL (ref 7.5–12.5)
Monocytes Relative: 6.6 %
Neutro Abs: 4641 {cells}/uL (ref 1500–7800)
Neutrophils Relative %: 66.3 %
Platelets: 255 Thousand/uL (ref 140–400)
RBC: 4.09 Million/uL (ref 3.80–5.10)
RDW: 12.5 % (ref 11.0–15.0)
Total Lymphocyte: 23.5 %
WBC: 7 Thousand/uL (ref 3.8–10.8)

## 2024-08-07 LAB — URINALYSIS, ROUTINE W REFLEX MICROSCOPIC
Bacteria, UA: NONE SEEN /HPF
Bilirubin Urine: NEGATIVE
Calcium Oxalate Crystal: NONE SEEN /HPF
Glucose, UA: NEGATIVE
Hyaline Cast: NONE SEEN /LPF
Ketones, ur: NEGATIVE
Leukocytes,Ua: NEGATIVE
Nitrite: NEGATIVE
Protein, ur: NEGATIVE
Specific Gravity, Urine: 1.007 (ref 1.001–1.035)
Squamous Epithelial / HPF: NONE SEEN /HPF
WBC, UA: NONE SEEN /HPF (ref 0–5)
pH: 6 (ref 5.0–8.0)

## 2024-08-07 LAB — HEMOGLOBIN A1C
Hgb A1c MFr Bld: 5.3 %
Mean Plasma Glucose: 105 mg/dL
eAG (mmol/L): 5.8 mmol/L

## 2024-08-07 LAB — TSH+FREE T4: TSH W/REFLEX TO FT4: 2.4 m[IU]/L (ref 0.40–4.50)

## 2024-08-07 LAB — MICROSCOPIC MESSAGE

## 2024-08-08 ENCOUNTER — Encounter: Admitting: Physical Therapy

## 2024-08-15 ENCOUNTER — Encounter: Admitting: Physical Therapy

## 2024-08-20 ENCOUNTER — Ambulatory Visit

## 2024-08-21 ENCOUNTER — Other Ambulatory Visit: Payer: Self-pay

## 2024-08-21 DIAGNOSIS — I10 Essential (primary) hypertension: Secondary | ICD-10-CM

## 2024-08-21 LAB — LIPID PANEL
Cholesterol: 227 mg/dL — ABNORMAL HIGH
HDL: 69 mg/dL
LDL Cholesterol (Calc): 141 mg/dL — ABNORMAL HIGH
Non-HDL Cholesterol (Calc): 158 mg/dL — ABNORMAL HIGH
Total CHOL/HDL Ratio: 3.3 (calc)
Triglycerides: 77 mg/dL

## 2024-08-21 LAB — COMPREHENSIVE METABOLIC PANEL WITH GFR
AG Ratio: 2.2 (calc) (ref 1.0–2.5)
ALT: 14 U/L (ref 6–29)
AST: 17 U/L (ref 10–35)
Albumin: 4.3 g/dL (ref 3.6–5.1)
Alkaline phosphatase (APISO): 72 U/L (ref 37–153)
BUN: 14 mg/dL (ref 7–25)
CO2: 31 mmol/L (ref 20–32)
Calcium: 9.5 mg/dL (ref 8.6–10.4)
Chloride: 103 mmol/L (ref 98–110)
Creat: 0.7 mg/dL (ref 0.50–1.05)
Globulin: 2 g/dL (ref 1.9–3.7)
Glucose, Bld: 82 mg/dL (ref 65–99)
Potassium: 4.6 mmol/L (ref 3.5–5.3)
Sodium: 140 mmol/L (ref 135–146)
Total Bilirubin: 0.5 mg/dL (ref 0.2–1.2)
Total Protein: 6.3 g/dL (ref 6.1–8.1)
eGFR: 97 mL/min/{1.73_m2}

## 2024-08-22 ENCOUNTER — Encounter: Admitting: Physical Therapy

## 2024-08-23 ENCOUNTER — Ambulatory Visit: Payer: Self-pay | Admitting: Internal Medicine

## 2024-08-29 ENCOUNTER — Ambulatory Visit

## 2024-08-29 ENCOUNTER — Encounter: Admitting: Physical Therapy

## 2024-09-05 ENCOUNTER — Encounter: Admitting: Physical Therapy

## 2024-09-12 ENCOUNTER — Encounter: Admitting: Physical Therapy

## 2024-09-19 ENCOUNTER — Encounter: Admitting: Physical Therapy

## 2024-09-26 ENCOUNTER — Encounter: Admitting: Physical Therapy

## 2024-11-07 ENCOUNTER — Encounter: Admitting: Family Medicine
